# Patient Record
Sex: Female | Born: 1960 | Race: Black or African American | Hispanic: No | Marital: Single | Smoking: Current every day smoker
Health system: Southern US, Community
[De-identification: ages and names within clinical notes are randomized; demographics above are authoritative.]

## PROBLEM LIST (undated history)

## (undated) DIAGNOSIS — I2510 Atherosclerotic heart disease of native coronary artery without angina pectoris: Secondary | ICD-10-CM

## (undated) DIAGNOSIS — F1410 Cocaine abuse, uncomplicated: Secondary | ICD-10-CM

## (undated) DIAGNOSIS — K219 Gastro-esophageal reflux disease without esophagitis: Secondary | ICD-10-CM

## (undated) DIAGNOSIS — F319 Bipolar disorder, unspecified: Secondary | ICD-10-CM

## (undated) DIAGNOSIS — D649 Anemia, unspecified: Secondary | ICD-10-CM

## (undated) DIAGNOSIS — F329 Major depressive disorder, single episode, unspecified: Secondary | ICD-10-CM

## (undated) DIAGNOSIS — I10 Essential (primary) hypertension: Secondary | ICD-10-CM

## (undated) DIAGNOSIS — E7800 Pure hypercholesterolemia, unspecified: Secondary | ICD-10-CM

## (undated) DIAGNOSIS — Z8659 Personal history of other mental and behavioral disorders: Secondary | ICD-10-CM

## (undated) DIAGNOSIS — M1990 Unspecified osteoarthritis, unspecified site: Secondary | ICD-10-CM

## (undated) DIAGNOSIS — F419 Anxiety disorder, unspecified: Secondary | ICD-10-CM

## (undated) DIAGNOSIS — T7840XA Allergy, unspecified, initial encounter: Secondary | ICD-10-CM

## (undated) DIAGNOSIS — G4730 Sleep apnea, unspecified: Secondary | ICD-10-CM

## (undated) DIAGNOSIS — F32A Depression, unspecified: Secondary | ICD-10-CM

## (undated) HISTORY — PX: ROTATOR CUFF REPAIR: SHX139

## (undated) HISTORY — PX: ECTOPIC PREGNANCY SURGERY: SHX613

## (undated) HISTORY — DX: Major depressive disorder, single episode, unspecified: F32.9

## (undated) HISTORY — DX: Depression, unspecified: F32.A

## (undated) HISTORY — PX: HAND SURGERY: SHX662

## (undated) HISTORY — DX: Personal history of other mental and behavioral disorders: Z86.59

## (undated) HISTORY — DX: Anxiety disorder, unspecified: F41.9

## (undated) HISTORY — DX: Anemia, unspecified: D64.9

## (undated) HISTORY — DX: Unspecified osteoarthritis, unspecified site: M19.90

## (undated) HISTORY — DX: Allergy, unspecified, initial encounter: T78.40XA

---

## 2007-08-11 ENCOUNTER — Ambulatory Visit: Payer: Self-pay | Admitting: *Deleted

## 2007-11-22 ENCOUNTER — Ambulatory Visit: Payer: Self-pay | Admitting: *Deleted

## 2007-12-13 ENCOUNTER — Emergency Department: Payer: Self-pay

## 2009-08-09 ENCOUNTER — Emergency Department: Payer: Self-pay | Admitting: Emergency Medicine

## 2009-08-15 ENCOUNTER — Emergency Department: Payer: Self-pay | Admitting: Emergency Medicine

## 2011-01-25 ENCOUNTER — Emergency Department: Payer: Self-pay | Admitting: Emergency Medicine

## 2011-08-19 ENCOUNTER — Emergency Department: Payer: Self-pay | Admitting: Unknown Physician Specialty

## 2011-08-19 ENCOUNTER — Emergency Department: Payer: Self-pay | Admitting: Emergency Medicine

## 2012-05-10 ENCOUNTER — Ambulatory Visit: Payer: Self-pay | Admitting: Physician Assistant

## 2012-05-30 ENCOUNTER — Ambulatory Visit: Payer: Self-pay | Admitting: Physician Assistant

## 2012-08-22 ENCOUNTER — Other Ambulatory Visit: Payer: Self-pay | Admitting: Internal Medicine

## 2012-08-26 ENCOUNTER — Other Ambulatory Visit: Payer: Self-pay | Admitting: Internal Medicine

## 2012-08-26 ENCOUNTER — Other Ambulatory Visit: Payer: Self-pay

## 2012-08-26 DIAGNOSIS — R1013 Epigastric pain: Secondary | ICD-10-CM

## 2012-08-26 DIAGNOSIS — R1310 Dysphagia, unspecified: Secondary | ICD-10-CM

## 2012-09-01 ENCOUNTER — Ambulatory Visit: Payer: Self-pay

## 2012-09-02 ENCOUNTER — Ambulatory Visit
Admission: RE | Admit: 2012-09-02 | Discharge: 2012-09-02 | Disposition: A | Discharge status: Home / Self Care | Payer: Medicaid Other | Source: Ambulatory Visit | Attending: Internal Medicine | Admitting: Internal Medicine

## 2012-09-02 ENCOUNTER — Other Ambulatory Visit: Payer: Self-pay | Admitting: Internal Medicine

## 2012-09-02 DIAGNOSIS — R1013 Epigastric pain: Secondary | ICD-10-CM

## 2012-09-23 ENCOUNTER — Ambulatory Visit: Payer: Self-pay | Admitting: Physician Assistant

## 2012-12-01 ENCOUNTER — Ambulatory Visit: Payer: Self-pay | Admitting: Physician Assistant

## 2013-01-04 ENCOUNTER — Other Ambulatory Visit: Payer: Self-pay | Admitting: Internal Medicine

## 2013-01-04 DIAGNOSIS — M25511 Pain in right shoulder: Secondary | ICD-10-CM

## 2013-01-12 ENCOUNTER — Other Ambulatory Visit: Payer: Medicaid Other

## 2013-01-13 ENCOUNTER — Ambulatory Visit: Payer: Self-pay | Admitting: Physician Assistant

## 2013-01-17 ENCOUNTER — Other Ambulatory Visit: Payer: Medicaid Other

## 2013-01-27 ENCOUNTER — Other Ambulatory Visit: Payer: Medicaid Other

## 2013-03-21 ENCOUNTER — Ambulatory Visit: Payer: Self-pay | Admitting: Gastroenterology

## 2013-03-22 ENCOUNTER — Observation Stay: Payer: Self-pay | Admitting: Internal Medicine

## 2013-03-22 LAB — BASIC METABOLIC PANEL
Calcium, Total: 9.6 mg/dL (ref 8.5–10.1)
Co2: 24 mmol/L (ref 21–32)
Creatinine: 0.98 mg/dL (ref 0.60–1.30)
EGFR (African American): >60
EGFR (Non-African Amer.): >60
Osmolality: 274 (ref 275–301)
Sodium: 137 mmol/L (ref 136–145)

## 2013-03-22 LAB — CBC
HGB: 12.2 g/dL (ref 12.0–16.0)
MCHC: 33.7 g/dL (ref 32.0–36.0)
Platelet: 253 10*3/uL (ref 150–440)
RBC: 4.15 10*6/uL (ref 3.80–5.20)

## 2013-03-23 LAB — PATHOLOGY REPORT

## 2013-08-04 ENCOUNTER — Ambulatory Visit: Payer: Self-pay | Admitting: Physician Assistant

## 2013-10-13 ENCOUNTER — Ambulatory Visit: Payer: Self-pay | Admitting: Physician Assistant

## 2014-01-04 DIAGNOSIS — M75121 Complete rotator cuff tear or rupture of right shoulder, not specified as traumatic: Secondary | ICD-10-CM | POA: Insufficient documentation

## 2014-05-01 ENCOUNTER — Ambulatory Visit: Payer: Self-pay | Admitting: Unknown Physician Specialty

## 2014-05-01 LAB — BASIC METABOLIC PANEL
ANION GAP: 5 — AB (ref 7–16)
BUN: 6 mg/dL — ABNORMAL LOW (ref 7–18)
CO2: 29 mmol/L (ref 21–32)
Calcium, Total: 9.1 mg/dL (ref 8.5–10.1)
Chloride: 105 mmol/L (ref 98–107)
Creatinine: 1.18 mg/dL (ref 0.60–1.30)
EGFR (African American): >60
EGFR (Non-African Amer.): 51 — ABNORMAL LOW
Glucose: 96 mg/dL (ref 65–99)
Osmolality: 275 (ref 275–301)
POTASSIUM: 4.3 mmol/L (ref 3.5–5.1)
Sodium: 139 mmol/L (ref 136–145)

## 2014-05-09 ENCOUNTER — Ambulatory Visit: Payer: Self-pay | Admitting: Unknown Physician Specialty

## 2014-07-17 DIAGNOSIS — Z9889 Other specified postprocedural states: Secondary | ICD-10-CM | POA: Insufficient documentation

## 2014-07-31 ENCOUNTER — Encounter
Admit: 2014-07-31 | Disposition: A | Discharge status: Home / Self Care | Payer: Self-pay | Attending: Unknown Physician Specialty | Admitting: Unknown Physician Specialty

## 2014-08-10 NOTE — H&P (Signed)
PATIENT NAME:  Christina Richards, Christina Richards MR#:  [id number] DATE OF BIRTH:  March 24, 1961  DATE OF ADMISSION:  03/22/2013  PRIMARY CARE PHYSICIAN: Mary Sella. Turner, PA-C  CHIEF COMPLAINT: Angioedema.   HISTORY OF PRESENT ILLNESS: This is a 54 year old female who presented yesterday for a colonoscopy. She was given routine sedatives of which I am not sure which ones. In any case, she went home and this morning she noticed that her lower lip was swollen. She came to the ER for further evaluation. During the course of the ER visit, her swelling actually increased and now it is her lower and upper lips. She has received treatment for allergic reaction including steroids, Benadryl, but the swelling had increased, so we are admitting her for observation.   REVIEW OF SYSTEMS: CONSTITUTIONAL: No fevers, fatigue, weakness. EYES: No blurred or double vision.  ENT: No tinnitus, ear pain, hearing loss, seasonal allergies, or postnasal drip.  RESPIRATORY: No cough, wheezing, hemoptysis, COPD.  CARDIOVASCULAR: No chest pain, orthopnea, palpitations, or syncope. GASTROINTESTINAL: No nausea, vomiting, diarrhea, abdominal pain.  GENITOURINARY: No dysuria or hematuria. ENDOCRINE: No polyuria, polydipsia.  HEMOLYMPHATIC: No easy bruising.  SKIN: No rash or lesion. MUSCULOSKELETAL: No limited activity. NEUROLOGIC: No history of CVA or TIA.  PSYCHIATRIC: Positive history of manic depression.   PAST MEDICAL HISTORY:  1.  Hyperlipidemia.  2.  GERD.  3.  Manic depressive.  4.  Hypertension.  5.  Anxiety.  6.  Paranoid schizophrenic.  7.  Bipolar disorder.   PAST SURGICAL HISTORY:  1.  Ectopic pregnancy.  2.  Her left thumb.   MEDICATIONS:  Include: 1.  Trazodone 100 mg daily.  2.  Seroquel 200 mg two tablets daily.  3.  Pantoprazole 40 mg daily.  4.  Lisinopril 20 mg daily.  5.  Linzess 290 mcg daily.  6.  Hydroxyzine 50 mg daily.  7.  Citalopram 20 mg daily.  8.  Cardizem 240 mg daily.  9.  Benztropine 1 mg  b.i.d.   ALLERGIES: IBUPROFEN AND NOW PERHAPS LISINOPRIL.   FAMILY HISTORY: Positive for hypertension and diabetes.   SOCIAL HISTORY:  1.  The patient smokes a pack a day, is trying to work on quitting.  2.  No alcohol or IV drug use.   HISTORY OF PRESENT ILLNESS:  VITAL SIGNS: Temperature 98.6, pulse of 65, respirations 18, blood pressure 117/82, 100% on room air.  GENERAL: Alert, oriented, not in acute distress.  HEENT: Head is atraumatic. Pupils are round and reactive. Sclerae anicteric. Mucous membranes are moist.  OROPHARYNX: Clear. There is no throat swelling. Good vision of her oropharynx.  Her lip is very swollen.  NECK: Supple without any lymphadenopathy. No JVD. Thyroid is midline.  LUNGS: Clear to auscultation without any crackles, rales, rhonchi, or wheezing. Clear to percussion. Symmetrical expansion. No egophony or tactile fremitus.  CARDIOVASCULAR: Regular rate and rhythm. No murmurs, gallops, or rubs. Hard to palpate PMI due to body habitus.  GASTROINTESTINAL: No tenderness or mass. Bowel sounds are positive. No guarding, rebound or ecchymosis. Nondistended.  MUSCULOSKELETAL: No pathology to digits or nails. Extremities move x4.  SKIN: Without rashes or lesions. As mentioned, she has lip swelling.  NEUROLOGIC: Cranial nerves II through XII are grossly intact. There are no focal deficits. Motor strength is 4/5 bilateral in upper extremities.   LABORATORY, DIAGNOSTIC AND RADIOLOGICAL DATA: White blood cells 7, hemoglobin 12.2, hematocrit 37, platelets 253. Sodium 137, potassium 3.9, chloride 106, bicarb 24, BUN 11, creatinine 0.98, glucose 106.  ASSESSMENT AND PLAN: A 54 year old female who had a colonoscopy yesterday, is on an ACE inhibitor, who comes in with angioedema which has worsened throughout her ER course.   1.  ANGIOEDEMA, LIKELY DUE TO ACE INHIBITOR. I AM NOT SURE WHAT SHE RECEIVED DURING THE  COLONOSCOPY. I ASSUME IT IS PERHAPS PROPOFOL OR VERSED, BUT MORE THAN  LIKELY THIS ALLERGIC REACTION IS TO LISINOPRIL. WE ARE OBVIOUSLY HOLDING LISINOPRIL. LISINOPRIL HAS BEEN ADDED TO HER ALLERGY ADDED TO HER ALLERGIES. Will continue with steroids, Benadryl, and ranitidine.  2.  Hypertension. We will continue her diltiazem for now and monitor blood pressure. IF SHE NEEDS ANOTHER BLOOD PRESSURE MEDICATION, WOULD NOT OBVIOUSLY USE ANY ACE INHIBITOR OR ARB.  3.  History of bipolar affective disorder with manic depressive episode, paranoid schizophrenia. Will continue her outpatient medications.  4.  Smoking dependence. I counseled the patient regarding smoking. She is trying to quit. The patient was counseled for 4 minutes.  5.  She does not want a nicotine patch.   CODE STATUS: FULL CODE.  TIME SPENT: Approximately 40 minutes.   ____________________________ Janyth ContesSital P. Juliene PinaMody, MD spm:np D: 03/22/2013 16:18:57 ET T: 03/22/2013 18:45:19 ET JOB#: [id number]  cc: Lanyla Costello P. Juliene PinaMody, MD, <Dictator> Mary SellaEric M. Mayford Knifeurner, PA-C Maudy Yonan P Crysta Gulick MD ELECTRONICALLY SIGNED 03/23/2013 12:48

## 2014-08-10 NOTE — Discharge Summary (Signed)
PATIENT NAME:  Christina Richards, Christina Richards MR#:  [id number] DATE OF BIRTH:  08/22/1960  DATE OF ADMISSION:  03/22/2013 DATE OF DISCHARGE:  03/23/2013  ADMITTING DIAGNOSIS: Angioedema.   DISCHARGE DIAGNOSES: 1. ANGIOEDEMA, LIKELY DUE TO LISINOPRIL, QUESTIONABLE DUE TO ANESTHETIC USED FOR ESOPHAGOGASTRODUODENOSCOPY, COLONOSCOPY. 2.  Dysphagia to solids.  3.  Abdominal pain.  4.  Status post esophagogastroduodenoscopy, colonoscopy on 03/22/2013, by Dr. Servando Snare, with a normal esophagus, as well as duodenum were noted, and gastritis was noted, polyp in colon as well sigmoid diverticulosis and internal hemorrhoids.  5.  History of hypertension, schizophrenia, bipolar disorder, gastroesophageal reflux disease, as well as anxiety.   DISCHARGE CONDITION: Stable.     DISCHARGE MEDICATIONS: The patient is to continue:  1.  Benztropine 1 mg p.o. twice daily.  2.  Trazodone 100 mg p.o. at bedtime.  3.  Citalopram 20 mg p.o. daily.  4.  Hydroxyzine pamoate 50 mg p.o. daily.  5.  Pantoprazole 40 mg p.o. daily.  6.  Cardizem CD 240 mg p.o. daily.  7.  Linzess 290 mcg p.o. daily.  8.  Prednisone 60 mg p.o. once on 03/25/2013, then taper by 10 mg every 2 days.  9.  Benadryl 25 mg p.o. every 4 hours as needed.  10.  Metoprolol 100 mg p.o. twice daily.  11.   Ranitidine 150 mg p.o. twice daily.   The patient was advised not to take lisinopril anymore unless recommended by primary care physician.   HOME OXYGEN: None.   DIET: Two-gram salt, low fat, low cholesterol. Diet consistency: Mechanical soft.   ACTIVITY LIMITATIONS: As tolerated.     FOLLOWUP APPOINTMENT: With Dr. Mayford Knife in 2 days after discharge, Dr. Servando Snare in 1 week after discharge.   The patient is a 54 year old African American female with past medical history significant for history of multiple medical problems including dysphagia, who presents to the hospital with angioedema. Apparently, the patient had a colonoscopy, as well as EGD on 03/22/2013. Upon  arrival home from procedure, she noted that her lower lip was swollen. The swelling progressed from one cheek to the other cheek. She presented to the Emergency Room for further evaluation and received steroids, as well as Benadryl and Zantac. She was admitted to the hospital for further evaluation, as well as treatment and observation. On arrival to the Emergency Room, the patient's temperature was 98.6, pulse was 65, respiration rate was 18, blood pressure 117/82, saturation was 100% on room air. Physical exam was unremarkable, except for significant swelling in her periorbital area, but not tongue. The patient's lab data done in the Emergency Room revealed normal BMP, except for a high glucose of 106. The patient's CBC was within normal limits with a white blood cell count of 7.0, hemoglobin 12.2, platelet count 253. The patient was admitted to the hospital for further evaluation. She was started on steroids around-the-clock, as well as (Dictation Anomaly) Benadryl and Zantac. With this, her condition improved. The swelling improved as well. She was noted to have elevated blood pressure since her lisinopril was placed on hold, and because of that, she was started on new medication for her, labetalol. With 100 mg of labetalol, the patient's blood pressure improved from 167 systolic to 112. She is being discharged home with the above-mentioned medications and followup. She is to follow up with her primary care physician for further recommendations and followup of her blood pressure management regimen. Her vital signs on the day of discharge, temperature was 98.1, pulse was 84, respiration  rate was 19, blood pressure was 112/72, saturation was 100% on room air at rest.   Of note, in regards to her chronic medical problems including hypertension, schizophrenia, bipolar disorder, gastroesophageal reflux disease, as well as anxiety, the patient is to continue her outpatient management.     TIME SPENT: 40 minutes on  this patient.   ____________________________ Katharina Caperima Tarshia Kot, MD rv:dmm D: 03/23/2013 19:19:48 ET T: 03/23/2013 22:16:26 ET JOB#: [id number]  cc: Katharina Caperima Clemma Johnsen, MD, <Dictator> Mary SellaEric M. Turner, PA-C Midge Miniumarren Wohl, MD Katharina CaperIMA Leyla Soliz MD ELECTRONICALLY SIGNED 03/29/2013 16:31

## 2014-08-19 NOTE — Op Note (Signed)
PATIENT NAME:  Christina Richards, Christina Richards MR#:  [id number] DATE OF BIRTH:  [birth year]  DATE OF PROCEDURE:  05/09/2014  PREOPERATIVE DIAGNOSIS: Torn right rotator cuff along with long head of biceps tendinitis and subacromial  impingement.   POSTOPERATIVE DIAGNOSIS:  Torn right rotator cuff along with long head of biceps tendinitis and impingement.   OPERATION: Arthroscopic release of the long head of the biceps tendon followed by arthroscopic subacromial decompression plus mini incision rotator cuff repair.   SURGEON:  Alda BertholdHarold B Gunter Conde, Jr., Richards.D.   ANESTHESIA: General.   HISTORY: The patient had a long history of right shoulder pain. She actually had a history of an injury to her shoulder in the past. Ultimately, she [facility]. Injection of right subacromial bursa with steroid and anesthetic did not relieve her symptoms. X-rays reveal no significant pathology. MRI was consistent with a large rotator cuff tear. The patient was ultimately brought in for surgery.   DESCRIPTION OF PROCEDURE: The patient was taken to the Operating Room where satisfactory general anesthesia was achieved. The patient turned to the lateral decubitus position with the right shoulder up. The right shoulder was prepped and draped in the usual fashion for about arthroscopic mini incision approach. The patient was given 2 grams of Kefzol IV prior to the start of the procedure. Incidentally, the shoulder was suspended with Acufex shoulder suspension device. The right upper extremity was maintained in about 25 degrees of abduction. We used 12 pounds of traction throughout the procedure.   The scope was introduced into the glenohumeral joint through a posterior portal. The joint was distended with lactated Ringer's. Inspection of the glenohumeral joint revealed the articular surfaces were smooth. The labrum was frayed. The biceps tendon was slightly frayed. 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 I went ahead and established an anterior portal and used an ArthroCare angled wand to perform a limited synovectomy. I released the long head of the biceps tendon attachment to the labrum with the angled ArthroCare wand.  Then I inserted the scope in the subacromial space. Inspection revealed a large rotator cuff tear that involved the supraspinatus and a portionof the infraspinatus. There was  significant fraying along the undersurface of the acromion, and there was a large spur along the inferior aspect of the anterior acromion. Lateral portal was established. I brought an ArthroCare wand in through this portal and debrided the frayed tissue on the undersurface of the acromion and debrided thickened bursal tissue.   I then switched the scope to the lateral portal and brought  an acromionizer bur in through the posterior portal and then a subacromial decompression was performed. I then switched the scope back to the posterior portal and brought a large round bur in through the lateral portal and lightly decorticated the area of the intended reattachment of the cuff.  I then removed the arthroscopic instruments and made a lateral incision beginning at the lateral scope portal and extended approximately to the edge of the acromion. The deltoid was divided in line with its fibers. A portion of the anterior deltoid was reflected off the acromion. I then went ahead and mobilized the cuff  and repaired it to the greater tuberosity. I used essentially a two row repair. Medially, I  used two Spartan anchors with #2 nonabsorbable sutures. They were placed through the cuff in horizontal mattress fashion and tied.  Then I brought these sutures over to speed  screws that were placed laterally. The sutures were crisscrossed, bringing the cuff down to the prepared greater tuberosity bed. Repair seemed to be fairly snug.   I need to add that I did make some vent holes in  the greater tuberosity prior to the repair. I used a small thin awl. The wound was  irrigated with saline and then I went ahead and reattached the released portion of the anterior deltoid to the acromion with a #2 Ethibond suture and then the incision with the deltoid were closed side to side with 0 Vicryl and subcutaneous with 2-0 Vicryl and the skin with skin staples. The anterior and posterior puncture wounds were closed with 3-0 nylon in vertical mattress fashion. Total of about 25 to 30 mL of half percent Marcaine was injected into the incision.  Betadine was  applied to the wounds. Four TENS pads were applied. Sterile dressing was then applied and then next, a shoulder immobilizer was applied to the patient's right upper extremity. The patient was then turned supine and awakened and transferred to her stretcher bed. She was taken to the recovery room in satisfactory condition.   ESTIMATED BLOOD LOSS:  Was negligible.     ____________________________ Alda Berthold., MD hbk:at D: 05/09/2014 10:29:36 ET T: 05/09/2014 10:48:42 ET JOB#: [id number]  cc: Alda Berthold., MD, <Dictator> Randon Goldsmith, Montez Hageman MD ELECTRONICALLY SIGNED 06/01/2014 15:22

## 2015-03-24 ENCOUNTER — Emergency Department
Admission: EM | Admit: 2015-03-24 | Discharge: 2015-03-24 | Disposition: A | Discharge status: Home / Self Care | Payer: Medicaid Other | Attending: Emergency Medicine | Admitting: Emergency Medicine

## 2015-03-24 ENCOUNTER — Emergency Department: Payer: Medicaid Other

## 2015-03-24 ENCOUNTER — Encounter: Payer: Self-pay | Admitting: Emergency Medicine

## 2015-03-24 DIAGNOSIS — Y9289 Other specified places as the place of occurrence of the external cause: Secondary | ICD-10-CM | POA: Insufficient documentation

## 2015-03-24 DIAGNOSIS — W109XXA Fall (on) (from) unspecified stairs and steps, initial encounter: Secondary | ICD-10-CM | POA: Diagnosis not present

## 2015-03-24 DIAGNOSIS — Y998 Other external cause status: Secondary | ICD-10-CM | POA: Insufficient documentation

## 2015-03-24 DIAGNOSIS — S93401A Sprain of unspecified ligament of right ankle, initial encounter: Secondary | ICD-10-CM

## 2015-03-24 DIAGNOSIS — F17200 Nicotine dependence, unspecified, uncomplicated: Secondary | ICD-10-CM | POA: Diagnosis not present

## 2015-03-24 DIAGNOSIS — S99911A Unspecified injury of right ankle, initial encounter: Secondary | ICD-10-CM | POA: Diagnosis present

## 2015-03-24 DIAGNOSIS — Y9389 Activity, other specified: Secondary | ICD-10-CM | POA: Diagnosis not present

## 2015-03-24 MED ORDER — IBUPROFEN 800 MG PO TABS
800.0000 mg | ORAL_TABLET | Freq: Once | ORAL | Status: AC
  Administered 2015-03-24: 800 mg via ORAL
  Filled 2015-03-24: qty 1

## 2015-03-24 MED ORDER — OXYCODONE-ACETAMINOPHEN 5-325 MG PO TABS: 1.0000 | ORAL_TABLET | Freq: Four times a day (QID) | ORAL | Status: DC | PRN

## 2015-03-24 MED ORDER — OXYCODONE-ACETAMINOPHEN 5-325 MG PO TABS
2.0000 | ORAL_TABLET | Freq: Once | ORAL | Status: AC
  Administered 2015-03-24: 2 via ORAL
  Filled 2015-03-24: qty 2

## 2015-03-24 MED ORDER — IBUPROFEN 800 MG PO TABS: 800.0000 mg | ORAL_TABLET | Freq: Three times a day (TID) | ORAL | Status: DC | PRN

## 2015-03-24 NOTE — Discharge Instructions (Signed)
Ankle Sprain  An ankle sprain is an injury to the strong, fibrous tissues (ligaments) that hold the bones of your ankle joint together.   CAUSES  An ankle sprain is usually caused by a fall or by twisting your ankle. Ankle sprains most commonly occur when you step on the outer edge of your foot, and your ankle turns inward. People who participate in sports are more prone to these types of injuries.   SYMPTOMS    Pain in your ankle. The pain may be present at rest or only when you are trying to stand or walk.   Swelling.   Bruising. Bruising may develop immediately or within 1 to 2 days after your injury.   Difficulty standing or walking, particularly when turning corners or changing directions.  DIAGNOSIS   Your caregiver will ask you details about your injury and perform a physical exam of your ankle to determine if you have an ankle sprain. During the physical exam, your caregiver will press on and apply pressure to specific areas of your foot and ankle. Your caregiver will try to move your ankle in certain ways. An X-ray exam may be done to be sure a bone was not broken or a ligament did not separate from one of the bones in your ankle (avulsion fracture).   TREATMENT   Certain types of braces can help stabilize your ankle. Your caregiver can make a recommendation for this. Your caregiver may recommend the use of medicine for pain. If your sprain is severe, your caregiver may refer you to a surgeon who helps to restore function to parts of your skeletal system (orthopedist) or a physical therapist.  HOME CARE INSTRUCTIONS    Apply ice to your injury for 1-2 days or as directed by your caregiver. Applying ice helps to reduce inflammation and pain.    Put ice in a plastic bag.    Place a towel between your skin and the bag.    Leave the ice on for 15-20 minutes at a time, every 2 hours while you are awake.   Only take over-the-counter or prescription medicines for pain, discomfort, or fever as directed by  your caregiver.   Elevate your injured ankle above the level of your heart as much as possible for 2-3 days.   If your caregiver recommends crutches, use them as instructed. Gradually put weight on the affected ankle. Continue to use crutches or a cane until you can walk without feeling pain in your ankle.   If you have a plaster splint, wear the splint as directed by your caregiver. Do not rest it on anything harder than a pillow for the first 24 hours. Do not put weight on it. Do not get it wet. You may take it off to take a shower or bath.   You may have been given an elastic bandage to wear around your ankle to provide support. If the elastic bandage is too tight (you have numbness or tingling in your foot or your foot becomes cold and blue), adjust the bandage to make it comfortable.   If you have an air splint, you may blow more air into it or let air out to make it more comfortable. You may take your splint off at night and before taking a shower or bath. Wiggle your toes in the splint several times per day to decrease swelling.  SEEK MEDICAL CARE IF:    You have rapidly increasing bruising or swelling.   Your toes feel   extremely cold or you lose feeling in your foot.   Your pain is not relieved with medicine.  SEEK IMMEDIATE MEDICAL CARE IF:   Your toes are numb or blue.   You have severe pain that is increasing.  MAKE SURE YOU:    Understand these instructions.   Will watch your condition.   Will get help right away if you are not doing well or get worse.     This information is not intended to replace advice given to you by your health care provider. Make sure you discuss any questions you have with your health care provider.     Document Released: 04/06/2005 Document Revised: 04/27/2014 Document Reviewed: 04/18/2011  Elsevier Interactive Patient Education 2016 [employer]

## 2015-03-24 NOTE — ED Notes (Signed)
Right ankle swollen, painful. No acute deformity.

## 2015-03-24 NOTE — ED Provider Notes (Signed)
[facility] Emergency Department Provider Note ___________________________________________  Time seen: Approximately 11:04 AM  I have reviewed the triage vital signs and the nursing notes.   HISTORY  Chief Complaint Ankle Pain   HPI Christina Richards is a 54 y.o. female who presents to the emergency department for evaluation of right ankle pain. She states that she was going down her steps and twisted her ankle last night. She states that it was painful all night and when she awakened this morning it was very swollen and she is unable to bear any weight on that side.  History reviewed. No pertinent past medical history.  There are no active problems to display for this patient.   History reviewed. No pertinent past surgical history.  Current Outpatient Rx  Name  Route  Sig  Dispense  Refill  . ibuprofen (ADVIL,MOTRIN) 800 MG tablet   Oral   Take 1 tablet (800 mg total) by mouth every 8 (eight) hours as needed.   30 tablet   0   . oxyCODONE-acetaminophen (ROXICET) 5-325 MG tablet   Oral   Take 1 tablet by mouth every 6 (six) hours as needed.   9 tablet   0     Allergies Review of patient's allergies indicates no known allergies.  No family history on file.  Social History Social History  Substance Use Topics  . Smoking status: Current Every Day Smoker  . Smokeless tobacco: None  . Alcohol Use: No    Review of Systems Constitutional: No recent illness. Eyes: No visual changes. ENT: No sore throat. Cardiovascular: Denies chest pain or palpitations. Respiratory: Denies shortness of breath. Gastrointestinal: No abdominal pain.  Genitourinary: Negative for dysuria. Musculoskeletal: Pain in right ankle Skin: Negative for rash. Neurological: Negative for headaches, focal weakness or numbness. 10-point ROS otherwise negative.  ____________________________________________   PHYSICAL EXAM:  VITAL SIGNS: ED Triage Vitals  Enc Vitals Group      BP --      Pulse --      Resp --      Temp --      Temp src --      SpO2 --      Weight --      Height --      Head Cir --      Peak Flow --      Pain Score 03/24/15 1032 8     Pain Loc --      Pain Edu? --      Excl. in GC? --     Constitutional: Alert and oriented. Well appearing and in no acute distress. Eyes: Conjunctivae are normal. EOMI. Head: Atraumatic. Nose: No congestion/rhinnorhea. Neck: No stridor.  Respiratory: Normal respiratory effort.   Musculoskeletal: Bilateral right ankle Neurologic:  Normal speech and language. No gross focal neurologic deficits are appreciated. Speech is normal. No gait instability. Skin:  Skin is warm, dry and intact. Atraumatic. Psychiatric: Mood and affect are normal. Speech and behavior are normal.  ____________________________________________   LABS (all labs ordered are listed, but only abnormal results are displayed)  Labs Reviewed - No data to display ____________________________________________  RADIOLOGY  Negative for acute bony abnormality per radiology.  I, Kem Boroughs, personally viewed and evaluated these images (plain radiographs) as part of my medical decision making.   ____________________________________________   PROCEDURES  Procedure(s) performed:   Ankle stirrup splint applied by RN. Neurovascularly intact post-application.   ____________________________________________   INITIAL IMPRESSION / ASSESSMENT AND PLAN /  ED COURSE  Pertinent labs & imaging results that were available during my care of the patient were reviewed by me and considered in my medical decision making (see chart for details).  Patient was advised to call tomorrow to schedule follow-up with the orthopedist. She was advised to use ice and elevate her foot. She was advised to avoid weightbearing. She was advised to return to the emergency department for symptoms that change or worsen if unable schedule an  appointment. ____________________________________________   FINAL CLINICAL IMPRESSION(S) / ED DIAGNOSES  Final diagnoses:  Ankle sprain, right, initial encounter       Christina PesterCari B Sayana Salley, FNP 03/24/15 1204  Sharyn CreamerMark Quale, MD 03/24/15 [phone]

## 2015-03-24 NOTE — ED Notes (Addendum)
States she is having pain to right ankle states she fell yesterday

## 2015-03-30 ENCOUNTER — Emergency Department: Payer: Medicaid Other

## 2015-03-30 ENCOUNTER — Emergency Department
Admission: EM | Admit: 2015-03-30 | Discharge: 2015-03-30 | Disposition: A | Discharge status: Home / Self Care | Payer: Medicaid Other | Attending: Emergency Medicine | Admitting: Emergency Medicine

## 2015-03-30 ENCOUNTER — Encounter: Payer: Self-pay | Admitting: Emergency Medicine

## 2015-03-30 DIAGNOSIS — F17200 Nicotine dependence, unspecified, uncomplicated: Secondary | ICD-10-CM | POA: Diagnosis not present

## 2015-03-30 DIAGNOSIS — S93401D Sprain of unspecified ligament of right ankle, subsequent encounter: Secondary | ICD-10-CM | POA: Insufficient documentation

## 2015-03-30 DIAGNOSIS — X58XXXD Exposure to other specified factors, subsequent encounter: Secondary | ICD-10-CM | POA: Insufficient documentation

## 2015-03-30 DIAGNOSIS — I10 Essential (primary) hypertension: Secondary | ICD-10-CM | POA: Insufficient documentation

## 2015-03-30 DIAGNOSIS — Z79899 Other long term (current) drug therapy: Secondary | ICD-10-CM | POA: Insufficient documentation

## 2015-03-30 DIAGNOSIS — S99911D Unspecified injury of right ankle, subsequent encounter: Secondary | ICD-10-CM | POA: Diagnosis present

## 2015-03-30 HISTORY — DX: Atherosclerotic heart disease of native coronary artery without angina pectoris: I25.10

## 2015-03-30 HISTORY — DX: Essential (primary) hypertension: I10

## 2015-03-30 HISTORY — DX: Gastro-esophageal reflux disease without esophagitis: K21.9

## 2015-03-30 HISTORY — DX: Pure hypercholesterolemia, unspecified: E78.00

## 2015-03-30 MED ORDER — KETOROLAC TROMETHAMINE 60 MG/2ML IM SOLN
60.0000 mg | Freq: Once | INTRAMUSCULAR | Status: AC
  Administered 2015-03-30: 60 mg via INTRAMUSCULAR
  Filled 2015-03-30: qty 2

## 2015-03-30 MED ORDER — OXYCODONE-ACETAMINOPHEN 5-325 MG PO TABS: 1.0000 | ORAL_TABLET | ORAL | Status: DC | PRN

## 2015-03-30 MED ORDER — NAPROXEN 500 MG PO TABS
500.0000 mg | ORAL_TABLET | Freq: Two times a day (BID) | ORAL | Status: DC
Start: 2015-03-30 — End: 2018-06-06

## 2015-03-30 NOTE — ED Notes (Signed)
States she had an injury to right ankle about 1 week ago conts to have pain with swelling .inhaled corticosteroids out of pain meds and has appt with ortho on the 15th of this month

## 2015-03-30 NOTE — ED Notes (Signed)
Patient presents to the ED with right ankle pain.  Patient "rolled" her ankle on the 4th of December and was seen in the ED.  Patient reports now being out of pain medication and continuing to have severe pain and difficulty walking.  Patient presents via EMS.  Patient has an ace wrap to her right ankle.

## 2015-03-30 NOTE — ED Provider Notes (Signed)
[facility] Emergency Department Provider Note  ____________________________________________  Time seen: Approximately 10:15 AM  I have reviewed the triage vital signs and the nursing notes.   HISTORY  Chief Complaint Ankle Pain   HPI Christina Richards is a 54 y.o. female who presents for evaluation of right ankle pain. Patient was seen here one ago for the same. States that she rolled her ankle fell on top of it and is having continued pain. Reports that the oxycodone and ibuprofen together are not helping.   Past Medical History  Diagnosis Date  . GERD (gastroesophageal reflux disease)   . Hypertension   . Coronary artery disease   . Hypercholesteremia     There are no active problems to display for this patient.   History reviewed. No pertinent past surgical history.  Current Outpatient Rx  Name  Route  Sig  Dispense  Refill  . atorvastatin (LIPITOR) 20 MG tablet   Oral   Take 20 mg by mouth daily.         . citalopram (CELEXA) 20 MG tablet   Oral   Take 20 mg by mouth daily.         Marland Kitchen. diltiazem (TIAZAC) 240 MG 24 hr capsule   Oral   Take 240 mg by mouth daily.         . hydrOXYzine (ATARAX/VISTARIL) 50 MG tablet   Oral   Take 50 mg by mouth 3 (three) times daily as needed.         . labetalol (NORMODYNE) 100 MG tablet   Oral   Take 100 mg by mouth 2 (two) times daily.         . pantoprazole (PROTONIX) 40 MG tablet   Oral   Take 40 mg by mouth daily.         . traZODone (DESYREL) 100 MG tablet   Oral   Take 100 mg by mouth at bedtime.         . naproxen (NAPROSYN) 500 MG tablet   Oral   Take 1 tablet (500 mg total) by mouth 2 (two) times daily with a meal.   60 tablet   0   . oxyCODONE-acetaminophen (ROXICET) 5-325 MG tablet   Oral   Take 1-2 tablets by mouth every 4 (four) hours as needed for severe pain.   20 tablet   0     Allergies Review of patient's allergies indicates no known allergies.  No family  history on file.  Social History Social History  Substance Use Topics  . Smoking status: Current Every Day Smoker  . Smokeless tobacco: None  . Alcohol Use: No    Review of Systems Constitutional: No fever/chills Eyes: No visual changes. ENT: No sore throat. Cardiovascular: Denies chest pain. Respiratory: Denies shortness of breath. Gastrointestinal: No abdominal pain.  No nausea, no vomiting.  No diarrhea.  No constipation. Genitourinary: Negative for dysuria. Musculoskeletal: Positive for right ankle pain. Skin: Negative for rash. Neurological: Negative for headaches, focal weakness or numbness.  10-point ROS otherwise negative.  ____________________________________________   PHYSICAL EXAM:  VITAL SIGNS: ED Triage Vitals  Enc Vitals Group     BP 03/30/15 0935 143/101 mmHg     Pulse Rate 03/30/15 0935 68     Resp 03/30/15 0935 18     Temp 03/30/15 0935 98.2 F (36.8 C)     Temp Source 03/30/15 0935 Oral     SpO2 03/30/15 0935 98 %     Weight  03/30/15 0935 280 lb (127.007 kg)     Height 03/30/15 0935  (1.626 m)     Head Cir --      Peak Flow --      Pain Score 03/30/15 0934 10     Pain Loc --      Pain Edu? --      Excl. in GC? --     Constitutional: Alert and oriented. Well appearing and in no acute distress. Cardiovascular: Normal rate, regular rhythm. Grossly normal heart sounds.  Good peripheral circulation. Respiratory: Normal respiratory effort.  No retractions. Lungs CTAB. Musculoskeletal: Positive for right ankle pain and swelling. Distally neurovascularly intact. Tendernessalong the posterior aspect of the foot along the calcaneal area. Neurologic:  Normal speech and language. No gross focal neurologic deficits are appreciated. No gait instability. Skin:  Skin is warm, dry and intact. No rash noted. Psychiatric: Mood and affect are normal. Speech and behavior are normal.  ____________________________________________   LABS (all labs ordered  are listed, but only abnormal results are displayed)  Labs Reviewed - No data to display ____________________________________________ RADIOLOGY  No new fracture dislocation. ____________________________________________   PROCEDURES  Procedure(s) performed: None  Critical Care performed: No  ____________________________________________   INITIAL IMPRESSION / ASSESSMENT AND PLAN / ED COURSE  Pertinent labs & imaging results that were available during my care of the patient were reviewed by me and considered in my medical decision making (see chart for details).  Right ankle sprain continuous. Patient to maintain splint as applied from one week ago. Continue current pain medications and to follow-up with orthopedics as scheduled. ____________________________________________   FINAL CLINICAL IMPRESSION(S) / ED DIAGNOSES  Final diagnoses:  Ankle sprain, right, subsequent encounter      Evangeline Dakin, PA-C 03/30/15 1540  Jene Every, MD 03/31/15 1326

## 2015-12-31 ENCOUNTER — Encounter: Payer: Medicaid Other | Admitting: Podiatry

## 2016-01-15 ENCOUNTER — Other Ambulatory Visit: Payer: Self-pay | Admitting: Nurse Practitioner

## 2016-01-15 DIAGNOSIS — Z1231 Encounter for screening mammogram for malignant neoplasm of breast: Secondary | ICD-10-CM

## 2016-01-17 ENCOUNTER — Ambulatory Visit: Payer: Medicaid Other | Admitting: Podiatry

## 2016-01-28 ENCOUNTER — Ambulatory Visit: Payer: Medicaid Other | Admitting: Podiatry

## 2016-02-04 ENCOUNTER — Ambulatory Visit: Payer: Self-pay | Admitting: Podiatry

## 2016-02-06 ENCOUNTER — Ambulatory Visit: Payer: Medicaid Other | Attending: Nurse Practitioner

## 2016-02-18 ENCOUNTER — Ambulatory Visit: Payer: Self-pay | Admitting: Podiatry

## 2016-05-15 NOTE — Progress Notes (Signed)
This encounter was created in error - please disregard.

## 2017-04-21 ENCOUNTER — Ambulatory Visit: Payer: Self-pay | Admitting: Nurse Practitioner

## 2017-04-27 ENCOUNTER — Other Ambulatory Visit: Payer: Self-pay

## 2017-04-27 MED ORDER — LABETALOL HCL 100 MG PO TABS: 100.0000 mg | ORAL_TABLET | Freq: Two times a day (BID) | ORAL | 2 refills | Status: DC

## 2017-04-27 MED ORDER — DILTIAZEM HCL ER BEADS 360 MG PO CP24: 360.0000 mg | ORAL_CAPSULE | Freq: Every day | ORAL | 3 refills | Status: DC

## 2017-04-28 ENCOUNTER — Other Ambulatory Visit: Payer: Self-pay

## 2017-04-28 NOTE — Telephone Encounter (Signed)
Called phar and canceled labetalol 100mg  and called in labetalol 200mg  twice a day

## 2017-05-05 DIAGNOSIS — J019 Acute sinusitis, unspecified: Secondary | ICD-10-CM | POA: Insufficient documentation

## 2017-05-05 DIAGNOSIS — B3731 Acute candidiasis of vulva and vagina: Secondary | ICD-10-CM | POA: Insufficient documentation

## 2017-05-05 DIAGNOSIS — N760 Acute vaginitis: Secondary | ICD-10-CM | POA: Insufficient documentation

## 2017-05-05 DIAGNOSIS — I10 Essential (primary) hypertension: Secondary | ICD-10-CM | POA: Insufficient documentation

## 2017-05-05 DIAGNOSIS — Z6841 Body Mass Index (BMI) 40.0 and over, adult: Secondary | ICD-10-CM | POA: Insufficient documentation

## 2017-05-05 DIAGNOSIS — B373 Candidiasis of vulva and vagina: Secondary | ICD-10-CM | POA: Insufficient documentation

## 2017-05-06 ENCOUNTER — Ambulatory Visit: Payer: Self-pay | Admitting: Nurse Practitioner

## 2017-05-09 DIAGNOSIS — Z9889 Other specified postprocedural states: Secondary | ICD-10-CM

## 2017-05-09 HISTORY — DX: Other specified postprocedural states: Z98.890

## 2017-05-18 ENCOUNTER — Other Ambulatory Visit: Payer: Self-pay | Admitting: Nurse Practitioner

## 2017-05-18 DIAGNOSIS — G8929 Other chronic pain: Secondary | ICD-10-CM

## 2017-05-18 DIAGNOSIS — M545 Low back pain, unspecified: Secondary | ICD-10-CM

## 2017-05-18 MED ORDER — TRAMADOL HCL 50 MG PO TABS: 100.0000 mg | ORAL_TABLET | Freq: Three times a day (TID) | ORAL | 0 refills | Status: DC | PRN

## 2017-05-18 NOTE — Progress Notes (Signed)
Renewed rx for tramadol 50mg , two tablets TID prn pain. #30 tablets sent in. She will need to be seen for further refills.

## 2017-07-05 ENCOUNTER — Encounter: Payer: Self-pay | Admitting: Nurse Practitioner

## 2017-07-05 ENCOUNTER — Ambulatory Visit (INDEPENDENT_AMBULATORY_CARE_PROVIDER_SITE_OTHER): Payer: Medicaid Other | Admitting: Nurse Practitioner

## 2017-07-05 VITALS — BP 144/96 | HR 74 | Temp 98.2°F | Resp 16 | Ht 63.0 in | Wt 289.0 lb

## 2017-07-05 DIAGNOSIS — M545 Low back pain, unspecified: Secondary | ICD-10-CM

## 2017-07-05 DIAGNOSIS — I10 Essential (primary) hypertension: Secondary | ICD-10-CM | POA: Diagnosis not present

## 2017-07-05 DIAGNOSIS — G8929 Other chronic pain: Secondary | ICD-10-CM

## 2017-07-05 DIAGNOSIS — J0140 Acute pansinusitis, unspecified: Secondary | ICD-10-CM | POA: Diagnosis not present

## 2017-07-05 MED ORDER — TRAMADOL HCL 50 MG PO TABS: 100.0000 mg | ORAL_TABLET | Freq: Three times a day (TID) | ORAL | 2 refills | Status: DC | PRN

## 2017-07-05 MED ORDER — AZITHROMYCIN 250 MG PO TABS: ORAL_TABLET | ORAL | 0 refills | Status: DC

## 2017-07-05 NOTE — Progress Notes (Signed)
[facility] [address] [place], [place] [address]  Internal MEDICINE  Office Visit Note  Patient Name: Christina Richards  [id number]  [id number]  Date of Service: 07/25/2017  Chief Complaint  Patient presents with  . URI  . Osteoarthritis     The patient is here for sick visit. She has nasal congestion, sore throat and headache. Symptoms have been going on for few weeks. nothing makes the symptoms worse. Has not tried to take anything to help the symptoms.  Continues to have issues with bilateral shoulder pain. Did have surgery on rotator cuff of right shoulder. Needs to have surgery on the left shoulder. Takes tramadol as needed for severe pain. This helps without causing negative side effects.  She has brought Fl-2 form with her which needs to be filled out and returned when complete.    Pt is here for a sick visit.     Current Medication:  Outpatient Encounter Medications as of 07/05/2017  Medication Sig  . ARIPiprazole ER (ABILIFY MAINTENA) 300 MG PRSY prefilled syringe Inject 300 mg into the muscle every 28 (twenty-eight) days.  . montelukast (SINGULAIR) 10 MG tablet Take 10 mg by mouth at bedtime.  . traMADol (ULTRAM) 50 MG tablet Take 2 tablets (100 mg total) by mouth every 8 (eight) hours as needed.  . traZODone (DESYREL) 100 MG tablet Take 100 mg by mouth at bedtime.  . [DISCONTINUED] traMADol (ULTRAM) 50 MG tablet Take 2 tablets (100 mg total) by mouth every 8 (eight) hours as needed.  Marland Kitchen atorvastatin (LIPITOR) 20 MG tablet Take 20 mg by mouth daily.  Marland Kitchen azithromycin (ZITHROMAX) 250 MG tablet z-pack - take as directed for 5 days  . citalopram (CELEXA) 20 MG tablet Take 20 mg by mouth daily.  Marland Kitchen diltiazem (TIAZAC) 360 MG 24 hr capsule Take 1 capsule (360 mg total) by mouth daily.  . hydrOXYzine (ATARAX/VISTARIL) 50 MG tablet Take 50 mg by mouth 3 (three) times daily as needed.  . labetalol (NORMODYNE) 200 MG tablet Take 200 mg by mouth 2 (two) times daily.  .  naproxen (NAPROSYN) 500 MG tablet Take 1 tablet (500 mg total) by mouth 2 (two) times daily with a meal.  . pantoprazole (PROTONIX) 40 MG tablet Take 40 mg by mouth daily.  . [DISCONTINUED] oxyCODONE-acetaminophen (ROXICET) 5-325 MG tablet Take 1-2 tablets by mouth every 4 (four) hours as needed for severe pain.   No facility-administered encounter medications on file as of 07/05/2017.       Medical History: Past Medical History:  Diagnosis Date  . Allergy    Environmental  . Anemia   . Anxiety   . Coronary artery disease   . Depression   . GERD (gastroesophageal reflux disease)   . Hypercholesteremia   . Hypertension      Today's Vitals   07/05/17 1515  BP: (!) 144/96  Pulse: 74  Resp: 16  Temp: 98.2 F (36.8 C)  TempSrc: Oral  SpO2: 98%  Weight: 289 lb (131.1 kg)  Height: 5\' 3"  (1.6 m)    Review of Systems  Constitutional: Positive for chills and fatigue.  HENT: Positive for congestion, ear pain, postnasal drip, rhinorrhea and sinus pain. Negative for sore throat and voice change.   Respiratory: Positive for cough and wheezing.   Cardiovascular: Negative.   Gastrointestinal: Negative.   Endocrine: Negative for cold intolerance, heat intolerance, polydipsia, polyphagia and polyuria.  Genitourinary: Negative.   Musculoskeletal: Positive for arthralgias.  Bilateral shoulder pain.   Allergic/Immunologic: Positive for environmental allergies.  Neurological: Positive for headaches.  Psychiatric/Behavioral:       Regularly sees psychiatry    Physical Exam  Constitutional: She is oriented to person, place, and time. She appears well-developed and well-nourished. No distress.  HENT:  Head: Normocephalic and atraumatic.  Right Ear: Tympanic membrane is erythematous and bulging.  Left Ear: Tympanic membrane is erythematous and bulging.  Nose: Rhinorrhea present. Right sinus exhibits maxillary sinus tenderness and frontal sinus tenderness. Left sinus exhibits  frontal sinus tenderness.  Mouth/Throat: Oropharynx is clear and moist. No oropharyngeal exudate.  Eyes: Pupils are equal, round, and reactive to light. EOM are normal.  Neck: Normal range of motion. Neck supple. No JVD present. No tracheal deviation present. No thyromegaly present.  Cardiovascular: Normal rate, regular rhythm and normal heart sounds. Exam reveals no gallop and no friction rub.  No murmur heard. Pulmonary/Chest: Effort normal. No respiratory distress. She has no wheezes. She has no rales. She exhibits no tenderness.  Abdominal: Soft. Bowel sounds are normal.  Musculoskeletal: Normal range of motion.  Lymphadenopathy:    She has no cervical adenopathy.  Neurological: She is alert and oriented to person, place, and time. No cranial nerve deficit.  Skin: Skin is warm and dry. She is not diaphoretic.  Psychiatric: She has a normal mood and affect. Her behavior is normal. Judgment and thought content normal.  Nursing note and vitals reviewed.  Assessment/Plan: 1. Acute pansinusitis, recurrence not specified - azithromycin (ZITHROMAX) 250 MG tablet; z-pack - take as directed for 5 days  Dispense: 6 tablet; Refill: 0 Recommend use of OTC medications to relieve symptoms.   2. Chronic low back pain without sciatica, unspecified back pain laterality - traMADol (ULTRAM) 50 MG tablet; Take 2 tablets (100 mg total) by mouth every 8 (eight) hours as needed.  Dispense: 30 tablet; Refill: 2  3. Essential (primary) hypertension Stable. Continue to use bp medications as prescribed.   General Counseling: Christina EpleyLisa verbalizes understanding of the findings of todays visit and agrees with plan of treatment. I have discussed any further diagnostic evaluation that may be needed or ordered today. We also reviewed her medications today. she has been encouraged to call the office with any questions or concerns that should arise related to todays visit.  Rest and increase fluids. Continue using OTC  medication to control symptoms.   This patient was seen by Vincent GrosHeather Chang Tiggs, FNP- C in Collaboration with Dr Lyndon CodeFozia M Khan as a part of collaborative care agreement   Meds ordered this encounter  Medications  . azithromycin (ZITHROMAX) 250 MG tablet    Sig: z-pack - take as directed for 5 days    Dispense:  6 tablet    Refill:  0    Order Specific Question:   Supervising Provider    Answer:   Lyndon CodeKHAN, FOZIA M [[id number]]  . traMADol (ULTRAM) 50 MG tablet    Sig: Take 2 tablets (100 mg total) by mouth every 8 (eight) hours as needed.    Dispense:  30 tablet    Refill:  2    Patient will need to be seen for further refills.    Order Specific Question:   Supervising Provider    Answer:   Lyndon CodeKHAN, FOZIA M [[id number]]    Time spent: 25 Minutes

## 2017-07-06 ENCOUNTER — Telehealth: Payer: Self-pay | Admitting: Nurse Practitioner

## 2017-07-07 NOTE — Telephone Encounter (Signed)
Finished this and Sela HuaKrupa just faxed it

## 2017-07-25 DIAGNOSIS — M545 Low back pain, unspecified: Secondary | ICD-10-CM | POA: Insufficient documentation

## 2017-07-25 DIAGNOSIS — G8929 Other chronic pain: Secondary | ICD-10-CM | POA: Insufficient documentation

## 2017-08-31 ENCOUNTER — Telehealth: Payer: Self-pay

## 2017-08-31 ENCOUNTER — Other Ambulatory Visit: Payer: Self-pay | Admitting: Nurse Practitioner

## 2017-08-31 DIAGNOSIS — J0140 Acute pansinusitis, unspecified: Secondary | ICD-10-CM

## 2017-08-31 MED ORDER — AMOXICILLIN-POT CLAVULANATE 875-125 MG PO TABS: 1.0000 | ORAL_TABLET | Freq: Two times a day (BID) | ORAL | 0 refills | Status: DC

## 2017-08-31 MED ORDER — METHYLPREDNISOLONE 4 MG PO TBPK
ORAL_TABLET | ORAL | 0 refills | Status: DC
Start: 2017-08-31 — End: 2018-10-20

## 2017-08-31 NOTE — Telephone Encounter (Signed)
Sent in prescription for augmentin bod for 10 days. Will have her do medrol dose pack for 6 days. Use otc meds to alleviate symptoms. Prescriptions sent to [facility].

## 2017-08-31 NOTE — Progress Notes (Signed)
Sent in prescription for augmentin bod for 10 days. Will have her do medrol dose pack for 6 days. Use otc meds to alleviate symptoms. Prescriptions sent to [facility].

## 2017-09-01 ENCOUNTER — Other Ambulatory Visit: Payer: Self-pay | Admitting: Internal Medicine

## 2017-09-01 MED ORDER — OMEPRAZOLE MAGNESIUM 20 MG PO TBEC: DELAYED_RELEASE_TABLET | ORAL | 2 refills | Status: DC

## 2017-09-01 MED ORDER — LABETALOL HCL 200 MG PO TABS: 200.0000 mg | ORAL_TABLET | Freq: Two times a day (BID) | ORAL | 2 refills | Status: DC

## 2017-09-02 ENCOUNTER — Telehealth: Payer: Self-pay

## 2017-09-02 NOTE — Telephone Encounter (Signed)
Left vm advising pt that we sent pred pk and antibiotic to pharmacy.  dbs

## 2017-09-29 ENCOUNTER — Other Ambulatory Visit: Payer: Self-pay | Admitting: Nurse Practitioner

## 2017-09-29 MED ORDER — DILTIAZEM HCL ER BEADS 360 MG PO CP24: 360.0000 mg | ORAL_CAPSULE | Freq: Every day | ORAL | 3 refills | Status: DC

## 2017-10-05 ENCOUNTER — Other Ambulatory Visit: Payer: Self-pay | Admitting: Nurse Practitioner

## 2017-10-08 ENCOUNTER — Telehealth: Payer: Self-pay

## 2017-10-08 NOTE — Telephone Encounter (Signed)
What did she need?

## 2017-10-08 NOTE — Telephone Encounter (Signed)
Gave this to krupa accidentally sent this to you.

## 2017-10-14 ENCOUNTER — Telehealth: Payer: Self-pay

## 2017-10-14 NOTE — Telephone Encounter (Signed)
[facility] has been [facility] due to non compliance of physician orders and appointment status.  Christina Richards

## 2017-12-03 ENCOUNTER — Other Ambulatory Visit: Payer: Self-pay | Admitting: Nurse Practitioner

## 2018-06-06 ENCOUNTER — Other Ambulatory Visit: Payer: Self-pay

## 2018-06-06 ENCOUNTER — Encounter: Payer: Self-pay | Admitting: Emergency Medicine

## 2018-06-06 ENCOUNTER — Emergency Department
Admission: EM | Admit: 2018-06-06 | Discharge: 2018-06-06 | Disposition: A | Discharge status: Home / Self Care | Payer: Medicaid Other | Attending: Emergency Medicine | Admitting: Emergency Medicine

## 2018-06-06 DIAGNOSIS — I2510 Atherosclerotic heart disease of native coronary artery without angina pectoris: Secondary | ICD-10-CM | POA: Insufficient documentation

## 2018-06-06 DIAGNOSIS — I10 Essential (primary) hypertension: Secondary | ICD-10-CM | POA: Diagnosis not present

## 2018-06-06 DIAGNOSIS — Z79899 Other long term (current) drug therapy: Secondary | ICD-10-CM | POA: Diagnosis not present

## 2018-06-06 DIAGNOSIS — J0140 Acute pansinusitis, unspecified: Secondary | ICD-10-CM

## 2018-06-06 DIAGNOSIS — R0981 Nasal congestion: Secondary | ICD-10-CM | POA: Diagnosis present

## 2018-06-06 DIAGNOSIS — Z760 Encounter for issue of repeat prescription: Secondary | ICD-10-CM | POA: Insufficient documentation

## 2018-06-06 DIAGNOSIS — J0100 Acute maxillary sinusitis, unspecified: Secondary | ICD-10-CM | POA: Diagnosis not present

## 2018-06-06 DIAGNOSIS — F17210 Nicotine dependence, cigarettes, uncomplicated: Secondary | ICD-10-CM | POA: Diagnosis not present

## 2018-06-06 MED ORDER — DILTIAZEM HCL ER BEADS 360 MG PO CP24: 360.0000 mg | ORAL_CAPSULE | Freq: Every day | ORAL | 3 refills | Status: DC

## 2018-06-06 MED ORDER — AMOXICILLIN-POT CLAVULANATE 875-125 MG PO TABS: 1.0000 | ORAL_TABLET | Freq: Two times a day (BID) | ORAL | 0 refills | Status: DC

## 2018-06-06 MED ORDER — LABETALOL HCL 200 MG PO TABS: 200.0000 mg | ORAL_TABLET | Freq: Two times a day (BID) | ORAL | 3 refills | Status: DC

## 2018-06-06 NOTE — ED Triage Notes (Signed)
Cold like symptoms x1 week  . Sinus pressure , right ear pressure.

## 2018-06-06 NOTE — ED Notes (Signed)
See triage note  Presents with right ear pain,sinus pressure and cough  States cough is occasional prod   Green in color

## 2018-06-06 NOTE — ED Triage Notes (Signed)
Blood pressure is elevated , pt currently out of her meds and is requesting refills

## 2018-06-06 NOTE — ED Provider Notes (Signed)
[facility] Emergency Department Provider Note   ____________________________________________   First MD Initiated Contact with Patient 06/06/18 [phone]     (approximate)  I have reviewed the triage vital signs and the nursing notes.   HISTORY  Chief Complaint Cough    HPI Christina Richards is a 58 y.o. female patient presents with nasal congestion and thick greenish nasal drainage.  Patient state complaint has increased in the past week.  Patient also requesting refill of hypertension medication.  Patient denies fever/chills associated complaint.  Patient denies nausea, vomiting, diarrhea.  Patient denies headache vision disturbance or weakness    Past Medical History:  Diagnosis Date  . Allergy    Environmental  . Anemia   . Anxiety   . Coronary artery disease   . Depression   . GERD (gastroesophageal reflux disease)   . Hypercholesteremia   . Hypertension     Patient Active Problem List   Diagnosis Date Noted  . Chronic low back pain without sciatica 07/25/2017  . Acute vaginitis 05/05/2017  . Candidiasis of vulva and vagina 05/05/2017  . Acute sinusitis, unspecified 05/05/2017  . Essential (primary) hypertension 05/05/2017  . Body mass index 45.0-49.9, adult (HCC) 05/05/2017    Past Surgical History:  Procedure Laterality Date  . ECTOPIC PREGNANCY SURGERY    . ROTATOR CUFF REPAIR      Prior to Admission medications   Medication Sig Start Date End Date Taking? Authorizing Provider  benztropine (COGENTIN) 2 MG tablet Take 2 mg by mouth 2 (two) times daily.   Yes [provider]  amoxicillin-clavulanate (AUGMENTIN) 875-125 MG tablet Take 1 tablet by mouth 2 (two) times daily. 06/06/18   Joni Reining, PA-C  ARIPiprazole ER (ABILIFY MAINTENA) 300 MG PRSY prefilled syringe Inject 300 mg into the muscle every 28 (twenty-eight) days.    [provider]  atorvastatin (LIPITOR) 20 MG tablet Take 20 mg by mouth daily.    [provider]  azithromycin (ZITHROMAX) 250 MG tablet z-pack - take as directed for 5 days 07/05/17   Carlean Jews, NP  citalopram (CELEXA) 20 MG tablet Take 20 mg by mouth daily.    [provider]  diltiazem (TIAZAC) 360 MG 24 hr capsule Take 1 capsule (360 mg total) by mouth daily. 06/06/18   Joni Reining, PA-C  hydrOXYzine (ATARAX/VISTARIL) 50 MG tablet Take 50 mg by mouth 3 (three) times daily as needed.    [provider]  labetalol (NORMODYNE) 200 MG tablet Take 1 tablet (200 mg total) by mouth 2 (two) times daily. 06/06/18   Joni Reining, PA-C  methylPREDNISolone (MEDROL) 4 MG TBPK tablet Take by mouth as directed for 6 days 08/31/17   Carlean Jews, NP  montelukast (SINGULAIR) 10 MG tablet Take 10 mg by mouth at bedtime.    [provider]  traMADol (ULTRAM) 50 MG tablet Take 2 tablets (100 mg total) by mouth every 8 (eight) hours as needed. 07/05/17   Carlean Jews, NP  traZODone (DESYREL) 100 MG tablet Take 100 mg by mouth at bedtime.    [provider]    Allergies Patient has no known allergies.  No family history on file.  Social History Social History   Tobacco Use  . Smoking status: Current Some Day Smoker  . Smokeless tobacco: Never Used  Substance Use Topics  . Alcohol use: No    Frequency: Never  . Drug use: Yes    Types: "Crack"  cocaine, Amyl nitrate    Review of Systems  Constitutional: No fever/chills Eyes: No visual changes. ENT: Nasal congestion and facial pain. Cardiovascular: Denies chest pain. Respiratory: Denies shortness of breath. Gastrointestinal: No abdominal pain.  No nausea, no vomiting.  No diarrhea.  No constipation. Genitourinary: Negative for dysuria. Musculoskeletal: Chronic back pain. Skin: Negative for rash. Neurological: Negative for headaches, focal weakness or numbness. Endocrine:  Hypertension. ____________________________________________   PHYSICAL EXAM:  VITAL SIGNS: ED  Triage Vitals  Enc Vitals Group     BP 06/06/18 0825 (!) 133/102     Pulse Rate 06/06/18 0825 74     Resp 06/06/18 0825 18     Temp 06/06/18 0825 98.1 F (36.7 C)     Temp Source 06/06/18 0825 Oral     SpO2 06/06/18 0825 98 %     Weight 06/06/18 0826 275 lb (124.7 kg)     Height 06/06/18 0826 5\' 4"  (1.626 m)     Head Circumference --      Peak Flow --      Pain Score 06/06/18 0826 7     Pain Loc --      Pain Edu? --      Excl. in GC? --     Constitutional: Alert and oriented. Well appearing and in no acute distress. Nose: Lateral maxillary guarding.  Bilateral edematous nasal turbinate.   Mouth/Throat: Mucous membranes are moist.  Oropharynx non-erythematous.  Postnasal drainage. Neck: No stridor.   Cardiovascular: Normal rate, regular rhythm. Grossly normal heart sounds.  Good peripheral circulation. Respiratory: Normal respiratory effort.  No retractions. Lungs CTAB. Skin:  Skin is warm, dry and intact. No rash noted. Psychiatric: Mood and affect are normal. Speech and behavior are normal.  ____________________________________________   LABS (all labs ordered are listed, but only abnormal results are displayed)  Labs Reviewed - No data to display ____________________________________________  EKG   ____________________________________________  RADIOLOGY  ED MD interpretation:    Official radiology report(s): No results found.  ____________________________________________   PROCEDURES  Procedure(s) performed: None  Procedures  Critical Care performed: No  ____________________________________________   INITIAL IMPRESSION / ASSESSMENT AND PLAN / ED COURSE  As part of my medical decision making, I reviewed the following data within the electronic MEDICAL RECORD NUMBER     Nasal congestion and facial pain secondary to subacute maxillary sinusitis.  Patient blood pressure refill request.  Patient given discharge care instruction advised [facility].      ____________________________________________   FINAL CLINICAL IMPRESSION(S) / ED DIAGNOSES  Final diagnoses:  Subacute maxillary sinusitis  Encounter for medication refill     ED Discharge Orders         Ordered    diltiazem (TIAZAC) 360 MG 24 hr capsule  Daily     06/06/18 0944    labetalol (NORMODYNE) 200 MG tablet  2 times daily     06/06/18 0944    amoxicillin-clavulanate (AUGMENTIN) 875-125 MG tablet  2 times daily     06/06/18 0944           Note:  This document was prepared using Dragon voice recognition software and may include unintentional dictation errors.    Joni Reining, PA-C 06/06/18 5374    Dionne Bucy, MD 06/06/18 [phone]

## 2018-10-20 ENCOUNTER — Other Ambulatory Visit: Payer: Self-pay

## 2018-10-20 ENCOUNTER — Ambulatory Visit: Payer: Medicaid Other | Admitting: Adult Health

## 2018-10-20 ENCOUNTER — Encounter: Payer: Self-pay | Admitting: Adult Health

## 2018-10-20 VITALS — BP 128/85 | HR 83 | Resp 16 | Ht 64.0 in | Wt 260.0 lb

## 2018-10-20 DIAGNOSIS — G8929 Other chronic pain: Secondary | ICD-10-CM

## 2018-10-20 DIAGNOSIS — M25552 Pain in left hip: Secondary | ICD-10-CM

## 2018-10-20 DIAGNOSIS — Z1239 Encounter for other screening for malignant neoplasm of breast: Secondary | ICD-10-CM | POA: Diagnosis not present

## 2018-10-20 DIAGNOSIS — M545 Low back pain, unspecified: Secondary | ICD-10-CM

## 2018-10-20 DIAGNOSIS — M25511 Pain in right shoulder: Secondary | ICD-10-CM | POA: Diagnosis not present

## 2018-10-20 DIAGNOSIS — E119 Type 2 diabetes mellitus without complications: Secondary | ICD-10-CM | POA: Diagnosis not present

## 2018-10-20 DIAGNOSIS — M25512 Pain in left shoulder: Secondary | ICD-10-CM

## 2018-10-20 DIAGNOSIS — Z1211 Encounter for screening for malignant neoplasm of colon: Secondary | ICD-10-CM

## 2018-10-20 DIAGNOSIS — R5383 Other fatigue: Secondary | ICD-10-CM

## 2018-10-20 LAB — POCT GLYCOSYLATED HEMOGLOBIN (HGB A1C): Hemoglobin A1C: 5.7 % — AB (ref 4.0–5.6)

## 2018-10-20 MED ORDER — LABETALOL HCL 200 MG PO TABS: 200.0000 mg | ORAL_TABLET | Freq: Two times a day (BID) | ORAL | 2 refills | Status: DC

## 2018-10-20 MED ORDER — DILTIAZEM HCL ER BEADS 360 MG PO CP24: 360.0000 mg | ORAL_CAPSULE | Freq: Every day | ORAL | 2 refills | Status: DC

## 2018-10-20 MED ORDER — TRAMADOL HCL 50 MG PO TABS: 100.0000 mg | ORAL_TABLET | Freq: Three times a day (TID) | ORAL | 0 refills | Status: DC | PRN

## 2018-10-20 NOTE — Patient Instructions (Signed)

## 2018-10-20 NOTE — Progress Notes (Signed)
[facility] [address] [place], [place] [address]  Internal MEDICINE  Office Visit Note  Patient Name: Christina SongsterLisa M Richards  [id number]  [id number]  Date of Service: 10/20/2018   Complaints/HPI Pt is here for establishment of PCP. Chief Complaint  Patient presents with  . Hypertension    re establish care ,needs medication refills   . Hyperlipidemia  . Gastroesophageal Reflux  . Quality Metric Gaps    pap, mammogram, colonoscopy   HPI Pt is here to reestablish care. She has a history of HLD, GERD and chronic shoulder pain.  She also sees psychiatry at [facility] for Schizoaffective disorder. She is currently compliant with her medications for them.  She is mainly here today to get her preventative health maintanace ordered, and to get a refill on her tramadol.  She reports her shoulders and neck are hurting very bad.  She has been taking tramadol intermittently for this pain, for awhile.  She reports her HTN and Genella RifeGerd are currently controlled.      Current Medication: Outpatient Encounter Medications as of 10/20/2018  Medication Sig  . ARIPiprazole ER (ABILIFY MAINTENA) 400 MG PRSY prefilled syringe Inject 400 mg into the muscle every 28 (twenty-eight) days.  . benztropine (COGENTIN) 2 MG tablet Take 2 mg by mouth 2 (two) times daily.  . citalopram (CELEXA) 20 MG tablet Take 20 mg by mouth daily.  Marland Kitchen. diltiazem (TIAZAC) 360 MG 24 hr capsule Take 1 capsule (360 mg total) by mouth daily.  . hydrOXYzine (ATARAX/VISTARIL) 50 MG tablet Take 50 mg by mouth 3 (three) times daily as needed.  . labetalol (NORMODYNE) 200 MG tablet Take 1 tablet (200 mg total) by mouth 2 (two) times daily.  . montelukast (SINGULAIR) 10 MG tablet Take 10 mg by mouth at bedtime.  Marland Kitchen. QUEtiapine (SEROQUEL) 100 MG tablet Take 100 mg by mouth at bedtime.  . traMADol (ULTRAM) 50 MG tablet Take 2 tablets (100 mg total) by mouth every 8 (eight) hours as needed.  . traZODone (DESYREL) 100 MG tablet Take 100 mg  by mouth at bedtime. 3 tablets at night  . [DISCONTINUED] ARIPiprazole ER (ABILIFY MAINTENA) 300 MG PRSY prefilled syringe Inject 300 mg into the muscle every 28 (twenty-eight) days.  . [DISCONTINUED] amoxicillin-clavulanate (AUGMENTIN) 875-125 MG tablet Take 1 tablet by mouth 2 (two) times daily. (Patient not taking: Reported on 10/20/2018)  . [DISCONTINUED] atorvastatin (LIPITOR) 20 MG tablet Take 20 mg by mouth daily.  . [DISCONTINUED] azithromycin (ZITHROMAX) 250 MG tablet z-pack - take as directed for 5 days (Patient not taking: Reported on 10/20/2018)  . [DISCONTINUED] methylPREDNISolone (MEDROL) 4 MG TBPK tablet Take by mouth as directed for 6 days (Patient not taking: Reported on 10/20/2018)   No facility-administered encounter medications on file as of 10/20/2018.     Surgical History: Past Surgical History:  Procedure Laterality Date  . ECTOPIC PREGNANCY SURGERY    . ROTATOR CUFF REPAIR      Medical History: Past Medical History:  Diagnosis Date  . Allergy    Environmental  . Anemia   . Anxiety   . Coronary artery disease   . Depression   . GERD (gastroesophageal reflux disease)   . Hypercholesteremia   . Hypertension     Family History: History reviewed. No pertinent family history.  Social History   Socioeconomic History  . Marital status: Single    Spouse name: Not on file  . Number of children: Not on file  . Years of education: Not  on file  . Highest education level: Not on file  Occupational History  . Not on file  Social Needs  . Financial resource strain: Not on file  . Food insecurity    Worry: Not on file    Inability: Not on file  . Transportation needs    Medical: Not on file    Non-medical: Not on file  Tobacco Use  . Smoking status: Light Tobacco Smoker  . Smokeless tobacco: Never Used  Substance and Sexual Activity  . Alcohol use: No    Frequency: Never  . Drug use: Yes    Types: "Crack" cocaine, Amyl nitrate  . Sexual activity: Never   Lifestyle  . Physical activity    Days per week: Not on file    Minutes per session: Not on file  . Stress: Not on file  Relationships  . Social Herbalist on phone: Not on file    Gets together: Not on file    Attends religious service: Not on file    Active member of club or organization: Not on file    Attends meetings of clubs or organizations: Not on file    Relationship status: Not on file  . Intimate partner violence    Fear of current or ex partner: Not on file    Emotionally abused: Not on file    Physically abused: Not on file    Forced sexual activity: Not on file  Other Topics Concern  . Not on file  Social History Narrative  . Not on file     Review of Systems  Constitutional: Negative for chills, fatigue and unexpected weight change.  HENT: Negative for congestion, rhinorrhea, sneezing and sore throat.   Eyes: Negative for photophobia, pain and redness.  Respiratory: Negative for cough, chest tightness and shortness of breath.   Cardiovascular: Negative for chest pain and palpitations.  Gastrointestinal: Negative for abdominal pain, constipation, diarrhea, nausea and vomiting.  Endocrine: Negative.   Genitourinary: Negative for dysuria and frequency.  Musculoskeletal: Negative for arthralgias, back pain, joint swelling and neck pain.  Skin: Negative for rash.  Allergic/Immunologic: Negative.   Neurological: Negative for tremors and numbness.  Hematological: Negative for adenopathy. Does not bruise/bleed easily.  Psychiatric/Behavioral: Negative for behavioral problems and sleep disturbance. The patient is not nervous/anxious.     Vital Signs: BP 128/85   Pulse 83   Resp 16   Ht 5\' 4"  (1.626 m)   Wt 260 lb (117.9 kg)   SpO2 96%   BMI 44.63 kg/m    Physical Exam Vitals signs and nursing note reviewed.  Constitutional:      General: She is not in acute distress.    Appearance: She is well-developed. She is not diaphoretic.  HENT:      Head: Normocephalic and atraumatic.     Mouth/Throat:     Pharynx: No oropharyngeal exudate.  Eyes:     Pupils: Pupils are equal, round, and reactive to light.  Neck:     Musculoskeletal: Normal range of motion and neck supple.     Thyroid: No thyromegaly.     Vascular: No JVD.     Trachea: No tracheal deviation.  Cardiovascular:     Rate and Rhythm: Normal rate and regular rhythm.     Heart sounds: Normal heart sounds. No murmur. No friction rub. No gallop.   Pulmonary:     Effort: Pulmonary effort is normal. No respiratory distress.     Breath sounds: Normal  breath sounds. No wheezing or rales.  Chest:     Chest wall: No tenderness.  Abdominal:     Palpations: Abdomen is soft.     Tenderness: There is no abdominal tenderness. There is no guarding.  Musculoskeletal: Normal range of motion.  Lymphadenopathy:     Cervical: No cervical adenopathy.  Skin:    General: Skin is warm and dry.  Neurological:     Mental Status: She is alert and oriented to person, place, and time.     Cranial Nerves: No cranial nerve deficit.  Psychiatric:        Behavior: Behavior normal.        Thought Content: Thought content normal.        Judgment: Judgment normal.    Assessment/Plan: 1. Diabetes mellitus without complication (HCC) A1C is 5.7 today.   - POCT HgB A1C  2. Chronic pain of both shoulders Pt had Right Rotator cuff repair.  Needs left, however both have chronic issues.  - traMADol (ULTRAM) 50 MG tablet; Take 2 tablets (100 mg total) by mouth every 8 (eight) hours as needed.  Dispense: 30 tablet; Refill: 0  3. Pain of left hip joint X ray of hip ordered  - DG Hip Unilat W OR W/O Pelvis 1V Left; Future  4. Screening for breast cancer Mammogram ordered - MM DIGITAL SCREENING BILATERAL; Future  5. Screen for colon cancer Pt is due for colonoscopy - Ambulatory referral to Gastroenterology  6. Fatigue, unspecified type Fatigue labs ordered for patient.  - CBC with  Differential/Platelet - Lipid Panel With LDL/HDL Ratio - TSH - T4, free - Comprehensive metabolic panel - Z61B12 and Folate Panel - Fe+TIBC+Fer - Vitamin D 1,25 dihydroxy  7. Chronic low back pain without sciatica, unspecified back pain laterality Pt was on Tramadol as previous patient for chronic low back pain.  She has been having severe pain in her shoulder, neck and back.  She would like to restart tramadol.  I agreed to refill tramadol for now, but we did discuss that she may have to see pain management for ongoing treatment if the need for controlled substances continues.  - traMADol (ULTRAM) 50 MG tablet; Take 2 tablets (100 mg total) by mouth every 8 (eight) hours as needed.  Dispense: 30 tablet; Refill: 0  General Counseling: Misty StanleyLisa verbalizes understanding of the findings of todays visit and agrees with plan of treatment. I have discussed any further diagnostic evaluation that may be needed or ordered today. We also reviewed her medications today. she has been encouraged to call the office with any questions or concerns that should arise related to todays visit.  Orders Placed This Encounter  Procedures  . POCT HgB A1C    No orders of the defined types were placed in this encounter.   Time spent: 25 Minutes   This patient was seen by Blima LedgerAdam Jacoria Keiffer AGNP-C in Collaboration with Dr Lyndon CodeFozia M Khan as a part of collaborative care agreement  Johnna AcostaAdam J. Petrea Fredenburg AGNP-C Internal Medicine

## 2018-10-24 ENCOUNTER — Other Ambulatory Visit: Payer: Self-pay

## 2018-10-24 ENCOUNTER — Telehealth: Payer: Self-pay

## 2018-10-24 DIAGNOSIS — Z1211 Encounter for screening for malignant neoplasm of colon: Secondary | ICD-10-CM

## 2018-10-24 NOTE — Telephone Encounter (Signed)
Gastroenterology Pre-Procedure Review  Request Date: 11/04/18 Requesting Physician: Dr. Bonna Gains  PATIENT REVIEW QUESTIONS: The patient responded to the following health history questions as indicated:    1. Are you having any GI issues? yes (Constipation) 2. Do you have a personal history of Polyps? no 3. Do you have a family history of Colon Cancer or Polyps? no 4. Diabetes Mellitus? no 5. Joint replacements in the past 12 months?no 6. Major health problems in the past 3 months?no 7. Any artificial heart valves, MVP, or defibrillator?no    MEDICATIONS & ALLERGIES:    Patient reports the following regarding taking any anticoagulation/antiplatelet therapy:   Plavix, Coumadin, Eliquis, Xarelto, Lovenox, Pradaxa, Brilinta, or Effient? no Aspirin? no  Patient confirms/reports the following medications:  Current Outpatient Medications  Medication Sig Dispense Refill  . ARIPiprazole ER (ABILIFY MAINTENA) 400 MG PRSY prefilled syringe Inject 400 mg into the muscle every 28 (twenty-eight) days.    . benztropine (COGENTIN) 2 MG tablet Take 2 mg by mouth 2 (two) times daily.    . citalopram (CELEXA) 20 MG tablet Take 20 mg by mouth daily.    Marland Kitchen diltiazem (TIAZAC) 360 MG 24 hr capsule Take 1 capsule (360 mg total) by mouth daily. 90 capsule 2  . hydrOXYzine (ATARAX/VISTARIL) 50 MG tablet Take 50 mg by mouth 3 (three) times daily as needed.    . labetalol (NORMODYNE) 200 MG tablet Take 1 tablet (200 mg total) by mouth 2 (two) times daily. 180 tablet 2  . montelukast (SINGULAIR) 10 MG tablet Take 10 mg by mouth at bedtime.    Marland Kitchen QUEtiapine (SEROQUEL) 100 MG tablet Take 100 mg by mouth at bedtime.    . traMADol (ULTRAM) 50 MG tablet Take 2 tablets (100 mg total) by mouth every 8 (eight) hours as needed. 30 tablet 0  . traZODone (DESYREL) 100 MG tablet Take 100 mg by mouth at bedtime. 3 tablets at night     No current facility-administered medications for this visit.     Patient  confirms/reports the following allergies:  Allergies  Allergen Reactions  . Ibuprofen Nausea Only    No orders of the defined types were placed in this encounter.   AUTHORIZATION INFORMATION Primary Insurance: 1D#: Group #:  Secondary Insurance: 1D#: Group #:  SCHEDULE INFORMATION: Date: 11/04/18 Time: Location:[facility]

## 2018-10-31 ENCOUNTER — Other Ambulatory Visit: Payer: Self-pay

## 2018-10-31 ENCOUNTER — Telehealth: Payer: Self-pay | Admitting: Gastroenterology

## 2018-10-31 ENCOUNTER — Ambulatory Visit
Admission: RE | Admit: 2018-10-31 | Discharge: 2018-10-31 | Disposition: A | Discharge status: Home / Self Care | Payer: Medicaid Other | Source: Ambulatory Visit | Attending: Adult Health | Admitting: Adult Health

## 2018-10-31 DIAGNOSIS — M25552 Pain in left hip: Secondary | ICD-10-CM | POA: Insufficient documentation

## 2018-10-31 MED ORDER — NA SULFATE-K SULFATE-MG SULF 17.5-3.13-1.6 GM/177ML PO SOLN: 1.0000 | Freq: Once | ORAL | 0 refills | Status: AC

## 2018-10-31 NOTE — Telephone Encounter (Signed)
Patient called & states she has not received her instructions for the colonoscopy & is scheduled for covid testing tomorrow. Please sent instructions to her email @ [email or web address]. Please called the prep into [facility] in [place] on [address].

## 2018-10-31 NOTE — Telephone Encounter (Signed)
LVM for patient to let her know her rx for Suprep has been sent to pharmacy.  Her instructions for colonoscopy have been emailed.  Asked pt to call me back if she has any questions.  Thanks [employer]

## 2018-11-01 ENCOUNTER — Other Ambulatory Visit: Payer: Self-pay | Admitting: Internal Medicine

## 2018-11-01 ENCOUNTER — Other Ambulatory Visit
Admission: RE | Admit: 2018-11-01 | Discharge: 2018-11-01 | Disposition: A | Discharge status: Home / Self Care | Payer: Medicaid Other | Source: Ambulatory Visit | Attending: Gastroenterology | Admitting: Gastroenterology

## 2018-11-01 ENCOUNTER — Telehealth: Payer: Self-pay

## 2018-11-01 DIAGNOSIS — M1380 Other specified arthritis, unspecified site: Secondary | ICD-10-CM

## 2018-11-01 DIAGNOSIS — Z1159 Encounter for screening for other viral diseases: Secondary | ICD-10-CM | POA: Insufficient documentation

## 2018-11-01 NOTE — Progress Notes (Signed)
fsh

## 2018-11-01 NOTE — Telephone Encounter (Signed)
Called pt and notified her that her x ray is normal. Pt was advised to get her lab work done as soon as possible so we are able to further any possible treatment and that DFK has added FSH/LH. Pt stated she will be sure to get labs done tomorrow morning.

## 2018-11-01 NOTE — Telephone Encounter (Signed)
-----   Message from Lavera Guise, MD sent at 11/01/2018  9:34 AM EDT ----- Can u call the pt and tell her she needs to go and have lab work done, I added FSH/LH, her Xray of the hip was WNL, she needs to go have lab work done before getting any other treatment

## 2018-11-02 LAB — SARS CORONAVIRUS 2 (TAT 6-24 HRS): SARS Coronavirus 2: NEGATIVE

## 2018-11-03 ENCOUNTER — Telehealth: Payer: Self-pay | Admitting: Gastroenterology

## 2018-11-03 ENCOUNTER — Encounter: Payer: Self-pay | Admitting: *Deleted

## 2018-11-03 NOTE — Telephone Encounter (Signed)
Pt is calling she has a procedure for tomorrow and ate food today she had eggs, salmon Pattys , and a fruit cocktail she states she could not read he instructions because she can not see well, she wuld like for someone  to go over her instructions with her and needs to reschedule her procedure please

## 2018-11-03 NOTE — Telephone Encounter (Signed)
Contacted patient explained to her that we need to reschedule her procedure.  I will call her back tomorrow to reschedule and go over the instructions with her in detail. Patient was unable to read instructions as she could not see well.  Thanks [employer]

## 2018-11-04 ENCOUNTER — Other Ambulatory Visit: Payer: Self-pay

## 2018-11-04 ENCOUNTER — Telehealth: Payer: Self-pay

## 2018-11-04 ENCOUNTER — Encounter: Admission: RE | Payer: Self-pay | Source: Home / Self Care

## 2018-11-04 ENCOUNTER — Ambulatory Visit: Admission: RE | Admit: 2018-11-04 | Payer: Medicaid Other | Source: Home / Self Care | Admitting: Gastroenterology

## 2018-11-04 DIAGNOSIS — Z1211 Encounter for screening for malignant neoplasm of colon: Secondary | ICD-10-CM

## 2018-11-04 SURGERY — COLONOSCOPY WITH PROPOFOL
Anesthesia: General

## 2018-11-04 NOTE — Telephone Encounter (Signed)
Patients colonoscopy has been rescheduled to 11/16/18 with Dr. Bonna Gains.  I reviewed her instructions with her in detail reading her diet and rx instructions.  I asked her to call me back again closer to her procedure date so we can go over the instructions again making sure she understands them.

## 2018-11-04 NOTE — Telephone Encounter (Signed)
Patient advised to have COVID test on 11/11/18.  Thanks [employer]

## 2018-11-11 ENCOUNTER — Telehealth: Payer: Self-pay | Admitting: Gastroenterology

## 2018-11-11 ENCOUNTER — Other Ambulatory Visit: Payer: Medicaid Other

## 2018-11-11 NOTE — Telephone Encounter (Signed)
Rescheduled to 12/09/18. Due to transportation.  Thanks [employer]

## 2018-11-11 NOTE — Telephone Encounter (Signed)
Patient called & states she needs to cancel her covid test for 11-11-18 & procedure on 11-16-18 due to transportation. She needs a Tuesday of Wednesday.

## 2018-11-17 ENCOUNTER — Encounter: Payer: Self-pay | Admitting: Adult Health

## 2018-11-17 ENCOUNTER — Ambulatory Visit (INDEPENDENT_AMBULATORY_CARE_PROVIDER_SITE_OTHER): Payer: Medicaid Other | Admitting: Adult Health

## 2018-11-17 ENCOUNTER — Other Ambulatory Visit: Payer: Self-pay

## 2018-11-17 VITALS — BP 139/85 | HR 69 | Resp 16 | Ht 64.0 in | Wt 263.0 lb

## 2018-11-17 DIAGNOSIS — S60221S Contusion of right hand, sequela: Secondary | ICD-10-CM

## 2018-11-17 DIAGNOSIS — I10 Essential (primary) hypertension: Secondary | ICD-10-CM | POA: Diagnosis not present

## 2018-11-17 DIAGNOSIS — M25511 Pain in right shoulder: Secondary | ICD-10-CM | POA: Diagnosis not present

## 2018-11-17 DIAGNOSIS — G8929 Other chronic pain: Secondary | ICD-10-CM

## 2018-11-17 DIAGNOSIS — M25512 Pain in left shoulder: Secondary | ICD-10-CM

## 2018-11-17 DIAGNOSIS — M25552 Pain in left hip: Secondary | ICD-10-CM | POA: Diagnosis not present

## 2018-11-17 DIAGNOSIS — M545 Low back pain: Secondary | ICD-10-CM | POA: Diagnosis not present

## 2018-11-17 MED ORDER — TRAMADOL HCL 50 MG PO TABS: 100.0000 mg | ORAL_TABLET | Freq: Three times a day (TID) | ORAL | 0 refills | Status: DC | PRN

## 2018-11-17 NOTE — Progress Notes (Signed)
[facility] [address] [place], [place] [address]  Internal MEDICINE  Office Visit Note  Patient Name: Christina SongsterLisa M Richards  [id number]  [id number]  Date of Service: 11/17/2018  Chief Complaint  Patient presents with  . Medical Management of Chronic Issues    4 week follow up , tramadol refill , still having pain in hip referral needed for ortho , hand pain     HPI Pt is here for 4 week follow up. Pt has not been able to have her blood drawn yet.  She depends on other people for rides.  She has her mammogram scheduled, next week and she will get her blood drawn that day also.  She is here today reporting ongoing pain in her left hip. She would like a referral for ortho at this time. She also has a small place on her right hand that is swollen and painful.       Current Medication: Outpatient Encounter Medications as of 11/17/2018  Medication Sig  . ARIPiprazole ER (ABILIFY MAINTENA) 400 MG PRSY prefilled syringe Inject 400 mg into the muscle every 28 (twenty-eight) days.  . benztropine (COGENTIN) 2 MG tablet Take 2 mg by mouth 2 (two) times daily.  . citalopram (CELEXA) 20 MG tablet Take 20 mg by mouth daily.  Marland Kitchen. diltiazem (TIAZAC) 360 MG 24 hr capsule Take 1 capsule (360 mg total) by mouth daily.  . hydrOXYzine (ATARAX/VISTARIL) 50 MG tablet Take 50 mg by mouth 3 (three) times daily as needed.  . labetalol (NORMODYNE) 200 MG tablet Take 1 tablet (200 mg total) by mouth 2 (two) times daily.  . montelukast (SINGULAIR) 10 MG tablet Take 10 mg by mouth at bedtime.  Marland Kitchen. QUEtiapine (SEROQUEL) 100 MG tablet Take 100 mg by mouth at bedtime.  . traMADol (ULTRAM) 50 MG tablet Take 2 tablets (100 mg total) by mouth every 8 (eight) hours as needed.  . traZODone (DESYREL) 100 MG tablet Take 100 mg by mouth at bedtime. 3 tablets at night  . [DISCONTINUED] traMADol (ULTRAM) 50 MG tablet Take 2 tablets (100 mg total) by mouth every 8 (eight) hours as needed.   No facility-administered  encounter medications on file as of 11/17/2018.     Surgical History: Past Surgical History:  Procedure Laterality Date  . ECTOPIC PREGNANCY SURGERY    . ROTATOR CUFF REPAIR      Medical History: Past Medical History:  Diagnosis Date  . Allergy    Environmental  . Anemia   . Anxiety   . Coronary artery disease   . Depression   . GERD (gastroesophageal reflux disease)   . Hypercholesteremia   . Hypertension     Family History: History reviewed. No pertinent family history.  Social History   Socioeconomic History  . Marital status: Single    Spouse name: Not on file  . Number of children: Not on file  . Years of education: Not on file  . Highest education level: Not on file  Occupational History  . Not on file  Social Needs  . Financial resource strain: Not on file  . Food insecurity    Worry: Not on file    Inability: Not on file  . Transportation needs    Medical: Not on file    Non-medical: Not on file  Tobacco Use  . Smoking status: Light Tobacco Smoker  . Smokeless tobacco: Never Used  Substance and Sexual Activity  . Alcohol use: No    Frequency: Never  .  Drug use: Yes    Types: "Crack" cocaine, Amyl nitrate  . Sexual activity: Never  Lifestyle  . Physical activity    Days per week: Not on file    Minutes per session: Not on file  . Stress: Not on file  Relationships  . Social Musicianconnections    Talks on phone: Not on file    Gets together: Not on file    Attends religious service: Not on file    Active member of club or organization: Not on file    Attends meetings of clubs or organizations: Not on file    Relationship status: Not on file  . Intimate partner violence    Fear of current or ex partner: Not on file    Emotionally abused: Not on file    Physically abused: Not on file    Forced sexual activity: Not on file  Other Topics Concern  . Not on file  Social History Narrative  . Not on file      Review of Systems  Constitutional:  Negative for chills, fatigue and unexpected weight change.  HENT: Negative for congestion, rhinorrhea, sneezing and sore throat.   Eyes: Negative for photophobia, pain and redness.  Respiratory: Negative for cough, chest tightness and shortness of breath.   Cardiovascular: Negative for chest pain and palpitations.  Gastrointestinal: Negative for abdominal pain, constipation, diarrhea, nausea and vomiting.  Endocrine: Negative.   Genitourinary: Negative for dysuria and frequency.  Musculoskeletal: Negative for arthralgias, back pain, joint swelling and neck pain.  Skin: Negative for rash.  Allergic/Immunologic: Negative.   Neurological: Negative for tremors and numbness.  Hematological: Negative for adenopathy. Does not bruise/bleed easily.  Psychiatric/Behavioral: Negative for behavioral problems and sleep disturbance. The patient is not nervous/anxious.     Vital Signs: BP 139/85   Pulse 69   Resp 16   Ht 5\' 4"  (1.626 m)   Wt 263 lb (119.3 kg)   SpO2 98%   BMI 45.14 kg/m    Physical Exam Vitals signs and nursing note reviewed.  Constitutional:      General: She is not in acute distress.    Appearance: She is well-developed. She is not diaphoretic.  HENT:     Head: Normocephalic and atraumatic.     Mouth/Throat:     Pharynx: No oropharyngeal exudate.  Eyes:     Pupils: Pupils are equal, round, and reactive to light.  Neck:     Musculoskeletal: Normal range of motion and neck supple.     Thyroid: No thyromegaly.     Vascular: No JVD.     Trachea: No tracheal deviation.  Cardiovascular:     Rate and Rhythm: Normal rate and regular rhythm.     Heart sounds: Normal heart sounds. No murmur. No friction rub. No gallop.   Pulmonary:     Effort: Pulmonary effort is normal. No respiratory distress.     Breath sounds: Normal breath sounds. No wheezing or rales.  Chest:     Chest wall: No tenderness.  Abdominal:     Palpations: Abdomen is soft.     Tenderness: There is no  abdominal tenderness. There is no guarding.  Musculoskeletal: Normal range of motion.  Lymphadenopathy:     Cervical: No cervical adenopathy.  Skin:    General: Skin is warm and dry.  Neurological:     Mental Status: She is alert and oriented to person, place, and time.     Cranial Nerves: No cranial nerve deficit.  Psychiatric:  Behavior: Behavior normal.        Thought Content: Thought content normal.        Judgment: Judgment normal.    Assessment/Plan: 1. Chronic pain of both shoulders Reviewed risks and possible side effects associated with taking opiates, benzodiazepines and other CNS depressants. Combination of these could cause dizziness and drowsiness. Advised patient not to drive or operate machinery when taking these medications, as patient's and other's life can be at risk and will have consequences. Patient verbalized understanding in this matter. Dependence and abuse for these drugs will be monitored closely. A Controlled substance policy and procedure is on file which allows [facility] to order a urine drug screen test at any visit. Patient understands and agrees with the plan - traMADol (ULTRAM) 50 MG tablet; Take 2 tablets (100 mg total) by mouth every 8 (eight) hours as needed.  Dispense: 30 tablet; Refill: 0  2. Pain of left hip joint Ongoing pain in left hip, pt requesting to see ortho - Ambulatory referral to Orthopedic Surgery  3. Essential (primary) hypertension Stable, continue present management.   4. Chronic low back pain without sciatica, unspecified back pain laterality - traMADol (ULTRAM) 50 MG tablet; Take 2 tablets (100 mg total) by mouth every 8 (eight) hours as needed.  Dispense: 30 tablet; Refill: 0  5. Contusion of right hand, sequela Most likely will heal without intervention.  Already better than it was per patient.  Continue to monitor.  RTC if does not improve.   General Counseling: sylver vantassell understanding of the  findings of todays visit and agrees with plan of treatment. I have discussed any further diagnostic evaluation that may be needed or ordered today. We also reviewed her medications today. she has been encouraged to call the office with any questions or concerns that should arise related to todays visit.    No orders of the defined types were placed in this encounter.   Meds ordered this encounter  Medications  . traMADol (ULTRAM) 50 MG tablet    Sig: Take 2 tablets (100 mg total) by mouth every 8 (eight) hours as needed.    Dispense:  30 tablet    Refill:  0    Patient will need to be seen for further refills.    Time spent: 15 Minutes   This patient was seen by Orson Gear AGNP-C in Collaboration with Dr Lavera Guise as a part of collaborative care agreement     Kendell Bane AGNP-C Internal medicine

## 2018-12-06 ENCOUNTER — Other Ambulatory Visit: Payer: Self-pay

## 2018-12-06 ENCOUNTER — Other Ambulatory Visit
Admission: RE | Admit: 2018-12-06 | Discharge: 2018-12-06 | Disposition: A | Discharge status: Home / Self Care | Payer: Medicaid Other | Source: Ambulatory Visit | Attending: Gastroenterology | Admitting: Gastroenterology

## 2018-12-06 DIAGNOSIS — Z01812 Encounter for preprocedural laboratory examination: Secondary | ICD-10-CM | POA: Diagnosis present

## 2018-12-06 DIAGNOSIS — Z20828 Contact with and (suspected) exposure to other viral communicable diseases: Secondary | ICD-10-CM | POA: Insufficient documentation

## 2018-12-07 LAB — SARS CORONAVIRUS 2 (TAT 6-24 HRS): SARS Coronavirus 2: NEGATIVE

## 2018-12-07 LAB — FSH/LH
FSH: 35.9 m[IU]/mL
LH: 22 m[IU]/mL

## 2018-12-09 ENCOUNTER — Encounter: Payer: Self-pay | Admitting: *Deleted

## 2018-12-09 ENCOUNTER — Encounter
Admission: RE | Disposition: A | Discharge status: Home / Self Care | Payer: Self-pay | Source: Home / Self Care | Attending: Gastroenterology

## 2018-12-09 ENCOUNTER — Ambulatory Visit
Admission: RE | Admit: 2018-12-09 | Discharge: 2018-12-09 | Disposition: A | Discharge status: Home / Self Care | Payer: Medicaid Other | Attending: Gastroenterology | Admitting: Gastroenterology

## 2018-12-09 DIAGNOSIS — Z5309 Procedure and treatment not carried out because of other contraindication: Secondary | ICD-10-CM | POA: Insufficient documentation

## 2018-12-09 DIAGNOSIS — Z1211 Encounter for screening for malignant neoplasm of colon: Secondary | ICD-10-CM

## 2018-12-09 LAB — URINE DRUG SCREEN, QUALITATIVE (ARMC ONLY)
Amphetamines, Ur Screen: NOT DETECTED
Barbiturates, Ur Screen: NOT DETECTED
Benzodiazepine, Ur Scrn: NOT DETECTED
Cannabinoid 50 Ng, Ur ~~LOC~~: POSITIVE — AB
Cocaine Metabolite,Ur ~~LOC~~: POSITIVE — AB
MDMA (Ecstasy)Ur Screen: NOT DETECTED
Methadone Scn, Ur: NOT DETECTED
Opiate, Ur Screen: NOT DETECTED
Phencyclidine (PCP) Ur S: NOT DETECTED
Tricyclic, Ur Screen: NOT DETECTED

## 2018-12-09 SURGERY — COLONOSCOPY WITH PROPOFOL
Anesthesia: General

## 2018-12-09 MED ORDER — SODIUM CHLORIDE 0.9 % IV SOLN: INTRAVENOUS | Status: DC

## 2018-12-09 NOTE — Progress Notes (Signed)
Patient gives history of using cocaine on Tuesday. UDS ordered. Patient decided to cancel procedure, states "I know the UDS is going to be positive". Dr. Bonna Gains notified and explained to patient that the office will call her for a rescedule. I Advised patient about where to get help for drug addiction and to abstain for at least 5 days prior to procedures for safety. She agreed and was very pleasant.

## 2018-12-10 LAB — CBC WITH DIFFERENTIAL/PLATELET
Basophils Absolute: 0 10*3/uL (ref 0.0–0.2)
Basos: 1 %
EOS (ABSOLUTE): 0 10*3/uL (ref 0.0–0.4)
Eos: 1 %
Hematocrit: 41.6 % (ref 34.0–46.6)
Hemoglobin: 13.9 g/dL (ref 11.1–15.9)
Immature Grans (Abs): 0 10*3/uL (ref 0.0–0.1)
Immature Granulocytes: 0 %
Lymphocytes Absolute: 1.1 10*3/uL (ref 0.7–3.1)
Lymphs: 29 %
MCH: 29 pg (ref 26.6–33.0)
MCHC: 33.4 g/dL (ref 31.5–35.7)
MCV: 87 fL (ref 79–97)
Monocytes Absolute: 0.4 10*3/uL (ref 0.1–0.9)
Monocytes: 10 %
Neutrophils Absolute: 2.2 10*3/uL (ref 1.4–7.0)
Neutrophils: 59 %
Platelets: 219 10*3/uL (ref 150–450)
RBC: 4.8 x10E6/uL (ref 3.77–5.28)
RDW: 13.7 % (ref 11.7–15.4)
WBC: 3.7 10*3/uL (ref 3.4–10.8)

## 2018-12-10 LAB — B12 AND FOLATE PANEL
Folate: 5.2 ng/mL (ref >3.0)
Vitamin B-12: 334 pg/mL (ref 232–1245)

## 2018-12-10 LAB — IRON,TIBC AND FERRITIN PANEL
Ferritin: 176 ng/mL — ABNORMAL HIGH (ref 15–150)
Iron Saturation: 14 % — ABNORMAL LOW (ref 15–55)
Iron: 45 ug/dL (ref 27–159)
Total Iron Binding Capacity: 324 ug/dL (ref 250–450)
UIBC: 279 ug/dL (ref 131–425)

## 2018-12-10 LAB — LIPID PANEL WITH LDL/HDL RATIO
Cholesterol, Total: 230 mg/dL — ABNORMAL HIGH (ref 100–199)
HDL: 67 mg/dL (ref >39)
LDL Calculated: 135 mg/dL — ABNORMAL HIGH (ref 0–99)
LDl/HDL Ratio: 2 ratio (ref 0.0–3.2)
Triglycerides: 140 mg/dL (ref 0–149)
VLDL Cholesterol Cal: 28 mg/dL (ref 5–40)

## 2018-12-10 LAB — T4, FREE: Free T4: 1.17 ng/dL (ref 0.82–1.77)

## 2018-12-10 LAB — COMPREHENSIVE METABOLIC PANEL
ALT: 8 IU/L (ref 0–32)
AST: 15 IU/L (ref 0–40)
Albumin/Globulin Ratio: 1.4 (ref 1.2–2.2)
Albumin: 4 g/dL (ref 3.8–4.9)
Alkaline Phosphatase: 61 IU/L (ref 39–117)
BUN/Creatinine Ratio: 11 (ref 9–23)
BUN: 12 mg/dL (ref 6–24)
Bilirubin Total: 0.6 mg/dL (ref 0.0–1.2)
CO2: 25 mmol/L (ref 20–29)
Calcium: 9 mg/dL (ref 8.7–10.2)
Chloride: 102 mmol/L (ref 96–106)
Creatinine, Ser: 1.05 mg/dL — ABNORMAL HIGH (ref 0.57–1.00)
GFR calc Af Amer: 67 mL/min/{1.73_m2} (ref >59)
GFR calc non Af Amer: 58 mL/min/{1.73_m2} — ABNORMAL LOW (ref >59)
Globulin, Total: 2.8 g/dL (ref 1.5–4.5)
Glucose: 102 mg/dL — ABNORMAL HIGH (ref 65–99)
Potassium: 4.1 mmol/L (ref 3.5–5.2)
Sodium: 141 mmol/L (ref 134–144)
Total Protein: 6.8 g/dL (ref 6.0–8.5)

## 2018-12-10 LAB — VITAMIN D 1,25 DIHYDROXY
Vitamin D 1, 25 (OH)2 Total: 71 pg/mL — ABNORMAL HIGH
Vitamin D2 1, 25 (OH)2: <10 pg/mL
Vitamin D3 1, 25 (OH)2: 71 pg/mL

## 2018-12-10 LAB — TSH: TSH: 1.11 u[IU]/mL (ref 0.450–4.500)

## 2018-12-20 ENCOUNTER — Telehealth: Payer: Self-pay

## 2018-12-20 NOTE — Telephone Encounter (Signed)
-----   Message from Virgel Manifold, MD sent at 12/09/2018  9:16 AM EDT ----- Reschedule colonoscopy. Pt reports cocaine use. Please advise her to not to use atleast 1 week prior to her procedure next time.

## 2018-12-20 NOTE — Telephone Encounter (Signed)
LMTCO.

## 2019-01-03 ENCOUNTER — Ambulatory Visit: Payer: Self-pay | Admitting: Nurse Practitioner

## 2019-01-09 ENCOUNTER — Telehealth: Payer: Self-pay

## 2019-01-09 NOTE — Telephone Encounter (Signed)
Patient has been contacted in regards to rescheduling her colonoscopy (Canceled due to drug use).  She has declined to reschedule at this time, states she has too many other appts.  Thanks [employer]

## 2019-01-18 ENCOUNTER — Ambulatory Visit: Payer: Self-pay | Admitting: Nurse Practitioner

## 2019-01-19 ENCOUNTER — Other Ambulatory Visit: Payer: Self-pay

## 2019-01-19 ENCOUNTER — Ambulatory Visit (INDEPENDENT_AMBULATORY_CARE_PROVIDER_SITE_OTHER): Payer: Medicaid Other | Admitting: Nurse Practitioner

## 2019-01-19 ENCOUNTER — Encounter: Payer: Self-pay | Admitting: Nurse Practitioner

## 2019-01-19 VITALS — BP 138/92 | HR 65 | Temp 97.3°F | Resp 16 | Ht 64.0 in | Wt 261.0 lb

## 2019-01-19 DIAGNOSIS — I10 Essential (primary) hypertension: Secondary | ICD-10-CM

## 2019-01-19 DIAGNOSIS — F1420 Cocaine dependence, uncomplicated: Secondary | ICD-10-CM | POA: Diagnosis not present

## 2019-01-19 DIAGNOSIS — F321 Major depressive disorder, single episode, moderate: Secondary | ICD-10-CM

## 2019-01-19 DIAGNOSIS — E785 Hyperlipidemia, unspecified: Secondary | ICD-10-CM | POA: Insufficient documentation

## 2019-01-19 NOTE — Progress Notes (Signed)
Christina Richards ([place]) [place], [place] [id number]  Internal MEDICINE  Office Visit Note  Patient Name: Christina Richards  [id number]  [id number]  Date of Service: 01/19/2019  Chief Complaint  Patient presents with  . Follow-up    labs  . Hypertension  . Hyperlipidemia  . Gastroesophageal Reflux  . Hip Pain    radiates down to leg   . Leg Pain    hurts her foot as well and her left foot hurts to walk on  . Depression    suicidal thoughts, acted on a plan once, but stopped by a friend    The patient is here for routine follow up. She is here to follow up regarding her labs. Her LDL and total cholesterol were both elevated mildly. Ferritin was mildly elevated as well. She does take an iron supplement every day. She takes this because she donates plasma regularly and this helps to build her blood levels back up. She is a smoker. She used to smoke about a pack of cigarettes per day. She has cut own significantly for this.  The patient had been scheduled to have screening colonoscopy. This was cancelled because she had a drug screen which was positive for cocaine. She used to see Dr. Holley Richards, psychiatry, for severe depression and anxiety. She states that when she saw Dr. Holley Richards, he had her completely clean and sober. She states that she uses cocaine to self medicate for severe mental health issues. Has been diagnosed with schizoid and bipolar disorder. He has since left the program she was attending. She continues to go to [facility] where he worked. She gets abilify injections every month. She also takes citalopram. She takes trazodone at night to help with sleep.  She is asking for prescription for tramadol. She has bilateral shoulder pain and low back pain and hip pain. She has taken tramadol off and on for this. Helps when she takes it. Discussed inability to prescribed any controlled medications while her urine drug screen is positive for cocaine. She voiced understanding.        Current Medication: Outpatient Encounter Medications as of 01/19/2019  Medication Sig  . ARIPiprazole ER (ABILIFY MAINTENA) 400 MG PRSY prefilled syringe Inject 400 mg into the muscle every 28 (twenty-eight) days.  . benztropine (COGENTIN) 2 MG tablet Take 2 mg by mouth 2 (two) times daily.  . citalopram (CELEXA) 20 MG tablet Take 20 mg by mouth daily.  Marland Kitchen diltiazem (TIAZAC) 360 MG 24 hr capsule Take 1 capsule (360 mg total) by mouth daily.  . hydrOXYzine (ATARAX/VISTARIL) 50 MG tablet Take 50 mg by mouth 3 (three) times daily as needed.  . labetalol (NORMODYNE) 200 MG tablet Take 1 tablet (200 mg total) by mouth 2 (two) times daily.  . montelukast (SINGULAIR) 10 MG tablet Take 10 mg by mouth at bedtime.  Marland Kitchen QUEtiapine (SEROQUEL) 100 MG tablet Take 100 mg by mouth at bedtime.  . traMADol (ULTRAM) 50 MG tablet Take 2 tablets (100 mg total) by mouth every 8 (eight) hours as needed.  . traZODone (DESYREL) 100 MG tablet Take 100 mg by mouth at bedtime. 3 tablets at night   No facility-administered encounter medications on file as of 01/19/2019.     Surgical History: Past Surgical History:  Procedure Laterality Date  . ECTOPIC PREGNANCY SURGERY    . ROTATOR CUFF REPAIR      Medical History: Past Medical History:  Diagnosis Date  . Allergy    Environmental  .  Anemia   . Anxiety   . Coronary artery disease   . Depression   . GERD (gastroesophageal reflux disease)   . Hypercholesteremia   . Hypertension     Family History: History reviewed. No pertinent family history.  Social History   Socioeconomic History  . Marital status: Single    Spouse name: Not on file  . Number of children: Not on file  . Years of education: Not on file  . Highest education level: Not on file  Occupational History  . Not on file  Social Needs  . Financial resource strain: Not on file  . Food insecurity    Worry: Not on file    Inability: Not on file  . Transportation needs     Medical: Not on file    Non-medical: Not on file  Tobacco Use  . Smoking status: Light Tobacco Smoker  . Smokeless tobacco: Never Used  Substance and Sexual Activity  . Alcohol use: No    Frequency: Never  . Drug use: Not Currently  . Sexual activity: Never  Lifestyle  . Physical activity    Days per week: Not on file    Minutes per session: Not on file  . Stress: Not on file  Relationships  . Social Musician on phone: Not on file    Gets together: Not on file    Attends religious service: Not on file    Active member of club or organization: Not on file    Attends meetings of clubs or organizations: Not on file    Relationship status: Not on file  . Intimate partner violence    Fear of current or ex partner: Not on file    Emotionally abused: Not on file    Physically abused: Not on file    Forced sexual activity: Not on file  Other Topics Concern  . Not on file  Social History Narrative  . Not on file      Review of Systems  Constitutional: Negative for chills, fatigue and unexpected weight change.  HENT: Negative for congestion, rhinorrhea, sneezing and sore throat.   Respiratory: Negative for cough, chest tightness, shortness of breath and wheezing.   Cardiovascular: Negative for chest pain and palpitations.       Elevated blood pressure.  Gastrointestinal: Negative for abdominal pain, constipation, diarrhea, nausea and vomiting.  Endocrine: Negative for cold intolerance, heat intolerance, polydipsia and polyuria.  Genitourinary: Negative for dysuria.  Musculoskeletal: Positive for arthralgias and myalgias. Negative for back pain, joint swelling and neck pain.       Bilateral shoulder (ain, low back pain, and left hip pain.   Skin: Negative for rash.  Allergic/Immunologic: Negative.   Neurological: Negative for dizziness, tremors, numbness and headaches.  Hematological: Negative for adenopathy. Does not bruise/bleed easily.  Psychiatric/Behavioral:  Positive for dysphoric mood. Negative for behavioral problems and sleep disturbance. The patient is nervous/anxious.    Today's Vitals   01/19/19 0853  BP: (!) 138/92  Pulse: 65  Resp: 16  Temp: (!) 97.3 F (36.3 C)  SpO2: 99%  Weight: 261 lb (118.4 kg)  Height: 5\' 4"  (1.626 m)   Body mass index is 44.8 kg/m.  Physical Exam Vitals signs and nursing note reviewed.  Constitutional:      Appearance: Normal appearance.  HENT:     Head: Normocephalic and atraumatic.  Eyes:     Pupils: Pupils are equal, round, and reactive to light.  Neck:  Musculoskeletal: Normal range of motion and neck supple.  Cardiovascular:     Rate and Rhythm: Normal rate and regular rhythm.     Heart sounds: Normal heart sounds.  Pulmonary:     Effort: Pulmonary effort is normal.     Breath sounds: Normal breath sounds.  Skin:    General: Skin is warm and dry.  Neurological:     Mental Status: She is alert and oriented to person, place, and time.  Psychiatric:        Attention and Perception: Attention and perception normal.        Mood and Affect: Mood is depressed.        Speech: Speech normal.        Behavior: Behavior normal.        Cognition and Memory: Cognition and memory normal.    Assessment/Plan: 1. Essential (primary) hypertension Generally stable. Continue bp medication as prescribed.  2. Hyperlipidemia, unspecified hyperlipidemia type Reviewed lipid panel with patient. Encouraged her to limit fried and fatty foods in the diet and increase routine exercise.   3. Depression, major, single episode, moderate (HCC) Will refer to [facility] for further evaluation and treatment. Advised her to continue with current treatment until established with new provider.  - Ambulatory referral to Psychiatry  4. Cocaine dependence without complication (HCC) Discussed risks, especially cardiac risks associated with using cocaine. Will try to get her set up with new psychiatric provider to help get her  off this completely.   General Counseling: Christina EpleyLisa verbalizes understanding of the findings of todays visit and agrees with plan of treatment. I have discussed any further diagnostic evaluation that may be needed or ordered today. We also reviewed her medications today. she has been encouraged to call the office with any questions or concerns that should arise related to todays visit.  Hypertension Counseling:   The following hypertensive lifestyle modification were recommended and discussed:  1. Limiting alcohol intake to less than 1 oz/day of ethanol:(24 oz of beer or 8 oz of wine or 2 oz of 100-proof whiskey). 2. Take baby ASA 81 mg daily. 3. Importance of regular aerobic exercise and losing weight. 4. Reduce dietary saturated fat and cholesterol intake for overall cardiovascular health. 5. Maintaining adequate dietary potassium, calcium, and magnesium intake. 6. Regular monitoring of the blood pressure. 7. Reduce sodium intake to less than 100 mmol/day (less than 2.3 gm of sodium or less than 6 gm of sodium choride)   This patient was seen by Vincent GrosHeather Shawntavia Saunders FNP Collaboration with Dr Lyndon CodeFozia M Khan as a part of collaborative care agreement  Orders Placed This Encounter  Procedures  . Ambulatory referral to Psychiatry     Time spent: 7730 Minutes      Dr Lyndon CodeFozia M Khan Internal medicine

## 2019-02-14 ENCOUNTER — Other Ambulatory Visit: Payer: Self-pay | Admitting: Podiatry

## 2019-02-14 ENCOUNTER — Ambulatory Visit: Payer: Medicaid Other

## 2019-02-14 ENCOUNTER — Encounter: Payer: Self-pay | Admitting: Podiatry

## 2019-02-14 ENCOUNTER — Ambulatory Visit (INDEPENDENT_AMBULATORY_CARE_PROVIDER_SITE_OTHER): Payer: Medicaid Other | Admitting: Podiatry

## 2019-02-14 ENCOUNTER — Ambulatory Visit (INDEPENDENT_AMBULATORY_CARE_PROVIDER_SITE_OTHER): Payer: Medicaid Other

## 2019-02-14 ENCOUNTER — Other Ambulatory Visit: Payer: Self-pay

## 2019-02-14 VITALS — BP 177/105 | HR 71

## 2019-02-14 DIAGNOSIS — M19072 Primary osteoarthritis, left ankle and foot: Secondary | ICD-10-CM

## 2019-02-14 DIAGNOSIS — M2141 Flat foot [pes planus] (acquired), right foot: Secondary | ICD-10-CM

## 2019-02-14 DIAGNOSIS — L600 Ingrowing nail: Secondary | ICD-10-CM

## 2019-02-14 DIAGNOSIS — M76821 Posterior tibial tendinitis, right leg: Secondary | ICD-10-CM

## 2019-02-14 DIAGNOSIS — M2142 Flat foot [pes planus] (acquired), left foot: Secondary | ICD-10-CM | POA: Diagnosis not present

## 2019-02-14 DIAGNOSIS — M21611 Bunion of right foot: Secondary | ICD-10-CM

## 2019-02-14 DIAGNOSIS — M722 Plantar fascial fibromatosis: Secondary | ICD-10-CM

## 2019-02-14 DIAGNOSIS — M76822 Posterior tibial tendinitis, left leg: Secondary | ICD-10-CM | POA: Diagnosis not present

## 2019-02-14 DIAGNOSIS — M21612 Bunion of left foot: Secondary | ICD-10-CM | POA: Diagnosis not present

## 2019-02-14 DIAGNOSIS — M19071 Primary osteoarthritis, right ankle and foot: Secondary | ICD-10-CM

## 2019-02-14 MED ORDER — MELOXICAM 7.5 MG PO TABS: 7.5000 mg | ORAL_TABLET | Freq: Every day | ORAL | 0 refills | Status: DC

## 2019-02-15 ENCOUNTER — Encounter: Payer: Self-pay | Admitting: Podiatry

## 2019-02-15 NOTE — Progress Notes (Signed)
Subjective:  Patient ID: Christina Richards, female    DOB: 1961/02/14,  MRN: [id number]  Chief Complaint  Patient presents with   Foot Pain    B/L feet are painful since 66 years    58 y.o. female presents with the above complaint.  She states her multiple poor blood flow person clearing the bilateral bunions with MPJ arthritis.  She also has bilateral hindfoot pain.  She is also complaining of ingrowing of both of her hallux.  She states that she has rigid pes planus deformity.  Her deformity deformity is bilateral.  Her pes planus deformity is the driving factor for most of her complaints on the foot.  She states that the pain has been around for 20 years has been gradual in onset but now it has gotten worse.  She has not tried any over-the-counter orthotics or any other modalities of treatment.  She denies any nausea fever chills or vomiting.  She ambulates in worn-out sneakers.   Review of Systems: Negative except as noted in the HPI. Denies N/V/F/Ch.  Past Medical History:  Diagnosis Date   Allergy    Environmental   Anemia    Anxiety    Coronary artery disease    Depression    GERD (gastroesophageal reflux disease)    Hypercholesteremia    Hypertension     Current Outpatient Medications:    ARIPiprazole ER (ABILIFY MAINTENA) 400 MG PRSY prefilled syringe, Inject 400 mg into the muscle every 28 (twenty-eight) days., Disp: , Rfl:    benztropine (COGENTIN) 2 MG tablet, Take 2 mg by mouth 2 (two) times daily., Disp: , Rfl:    citalopram (CELEXA) 20 MG tablet, Take 20 mg by mouth daily., Disp: , Rfl:    diltiazem (TIAZAC) 360 MG 24 hr capsule, Take 1 capsule (360 mg total) by mouth daily., Disp: 90 capsule, Rfl: 2   hydrOXYzine (ATARAX/VISTARIL) 50 MG tablet, Take 50 mg by mouth 3 (three) times daily as needed., Disp: , Rfl:    labetalol (NORMODYNE) 200 MG tablet, Take 1 tablet (200 mg total) by mouth 2 (two) times daily., Disp: 180 tablet, Rfl: 2   montelukast  (SINGULAIR) 10 MG tablet, Take 10 mg by mouth at bedtime., Disp: , Rfl:    QUEtiapine (SEROQUEL) 100 MG tablet, Take 100 mg by mouth at bedtime., Disp: , Rfl:    traMADol (ULTRAM) 50 MG tablet, Take 2 tablets (100 mg total) by mouth every 8 (eight) hours as needed., Disp: 30 tablet, Rfl: 0   traZODone (DESYREL) 100 MG tablet, Take 100 mg by mouth at bedtime. 3 tablets at night, Disp: , Rfl:    meloxicam (MOBIC) 7.5 MG tablet, Take 1 tablet (7.5 mg total) by mouth daily., Disp: 30 tablet, Rfl: 0  Social History   Tobacco Use  Smoking Status Light Tobacco Smoker  Smokeless Tobacco Never Used    Allergies  Allergen Reactions   Ibuprofen Nausea Only   Objective:   Vitals:   02/14/19 1109  BP: (!) 177/105  Pulse: 71   There is no height or weight on file to calculate BMI. Constitutional Well developed. Well nourished.  Vascular Dorsalis pedis pulses palpable bilaterally. Posterior tibial pulses palpable bilaterally. Capillary refill normal to all digits.  No cyanosis or clubbing noted. Pedal hair growth normal.  Neurologic Normal speech. Oriented to person, place, and time. Epicritic sensation to light touch grossly present bilaterally.  Dermatologic Nails well groomed and normal in appearance. No open wounds. No skin lesions.  Orthopedic:  Pain on palpation to the posterior tibial tendon insertions bilaterally.  Pain with eversion and inversion of the foot active and passive.  Exostosis of the navicular was palpated with pain.  No pain along the posterior tibial tendon only at the insertion.  Pain with range of motion active and passive first MPJ bilaterally.  Very limited motion at the first MPJ bilaterally.  Dorsal prominence noted at the first MPJ bilaterally hallux deviation noted bilaterally.  Pain on palpation proximal to the entire nail plate of the hallux bilaterally.  Both of the toenails are severely ingrown medially and laterally.  Upon gait examination severe  pes planus deformity noted unable to perform single and double heel raises.  Calcaneus eversion noted bilaterally.  To many toes sign is noted bilaterally.   Radiographs: 3 views of skeletally mature adult foot bilateral feet.  Severe osteoarthritic changes noted worst MPJ with osteophytes bilaterally.  There are multiple ossicles/osteophytes noted at medial aspect of the navicular likely in the posterior tibial tendon.  There is a decrease in calcaneal inclination angle increase in talar declination angle increase in Mary's angle.  These findings are consistent with pes planus deformity.  There is midfoot arthritis noted bilaterally. Assessment:   1. Posterior tibial tendinitis, left   2. Posterior tibial tendinitis of right leg   3. Arthritis of first metatarsophalangeal (MTP) joint of right foot   4. Arthritis of first metatarsophalangeal (MTP) joint of left foot   5. Ingrown toenail of right foot   6. Ingrown left big toenail   7. Pes planus of both feet    Plan:  Patient was evaluated and treated and all questions answered.  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  However, patient and I have discussed surgical interventions given the amount of osteoarthritic changes and the amount of pain that the patient is experiencing while ambulating.  I will discuss further during next visit. -A steroid injection was performed at bilateral first MPJ using 1% plain Lidocaine and 10 mg of Kenalog. This was well tolerated.  Bilateral posterior tibial tendinitis -Given the underlying pes planus deformity, the amount of stress and pain on the posterior tibial tendon and at the insertion, patient will benefit from orthotic intervention to help control the hindfoot motion.  However given that, patient also has ossicle/osteophytes on  x-ray at the navicular base patient may need surgical intervention with some debridement prior to orthotic intervention.  I will discuss this at next visit as well -A steroid injection was performed at medial aspect of the navicular not within the tendon using 1% plain Lidocaine and 10 mg of Kenalog. This was well tolerated.   Bilateral painful ingrown hallux both borders and top of the nail -Patient will benefit from a total nail avulsion not permanent.  I educated the patient that the pain will come back given that this is not a permanent procedure.  She understood the risk and has elected to undergo nonpermanent total nail avulsion.  Procedure: Excision of total  Toenail bilaterally Location: Bilateral 1st toe total nail borders. Anesthesia: Lidocaine 1% plain; 1.5 mL and Marcaine 0.5% plain; 1.5 mL, digital block. Skin Prep: Betadine. Dressing: Silvadene; telfa; dry, sterile, compression dressing. Technique: Following skin prep, the toe was exsanguinated and a tourniquet was secured at the base of the toe. The affected nail border was freed, split with  a nail splitter, and excised. The tourniquet was then removed and sterile dressing applied. Disposition: Patient tolerated procedure well. Patient to return in 2 weeks for follow-up.   Pes planus deformity -Patient will benefit from custom-made orthotics by Raiford Nobleick. She has clinical findings of flatfoot deformity with too many so toe sign calcaneal eversion and unable to perform heel raises.  Radiographic signs are all consistent with pes planus deformity severe in nature as well. -She is scheduled to see rec for custom made orthotics.     No follow-ups on file.    No follow-ups on file.

## 2019-02-21 ENCOUNTER — Other Ambulatory Visit: Payer: Self-pay

## 2019-02-21 ENCOUNTER — Encounter: Payer: Self-pay | Admitting: Psychiatry

## 2019-02-21 ENCOUNTER — Encounter: Payer: Medicaid Other | Admitting: Psychiatry

## 2019-02-22 ENCOUNTER — Other Ambulatory Visit: Payer: Self-pay | Admitting: Adult Health

## 2019-02-22 ENCOUNTER — Other Ambulatory Visit: Payer: Medicaid Other | Admitting: Nurse Practitioner

## 2019-02-22 ENCOUNTER — Telehealth: Payer: Self-pay

## 2019-02-22 ENCOUNTER — Other Ambulatory Visit: Payer: Medicaid Other | Admitting: Orthotics

## 2019-02-22 ENCOUNTER — Other Ambulatory Visit: Payer: Self-pay | Admitting: Podiatry

## 2019-02-22 DIAGNOSIS — G8929 Other chronic pain: Secondary | ICD-10-CM

## 2019-02-22 DIAGNOSIS — M545 Low back pain, unspecified: Secondary | ICD-10-CM

## 2019-02-22 DIAGNOSIS — M25511 Pain in right shoulder: Secondary | ICD-10-CM

## 2019-02-22 NOTE — Telephone Encounter (Signed)
Patient called and stated that the toe you took the nail off of is red, swollen, very painful and draining.  She is requesting an antibiotic to cover any infection.  Please advise if I can send in an antibiotic for her.  She is also requesting a refill of Meloxicam which I will go ahead and refill.   Thanks!

## 2019-02-24 NOTE — Telephone Encounter (Signed)
Last appt 01/19/19 and next 03/28/19 and last med refill in 7/20 by adam

## 2019-03-14 ENCOUNTER — Ambulatory Visit: Payer: Medicaid Other | Admitting: Podiatry

## 2019-03-20 ENCOUNTER — Ambulatory Visit: Payer: Medicaid Other | Admitting: Podiatry

## 2019-03-28 ENCOUNTER — Other Ambulatory Visit: Payer: Medicaid Other | Admitting: Nurse Practitioner

## 2019-03-30 ENCOUNTER — Ambulatory Visit (INDEPENDENT_AMBULATORY_CARE_PROVIDER_SITE_OTHER): Payer: Medicaid Other | Admitting: Podiatry

## 2019-03-30 ENCOUNTER — Other Ambulatory Visit: Payer: Self-pay

## 2019-03-30 DIAGNOSIS — M19072 Primary osteoarthritis, left ankle and foot: Secondary | ICD-10-CM

## 2019-03-30 DIAGNOSIS — M76821 Posterior tibial tendinitis, right leg: Secondary | ICD-10-CM | POA: Diagnosis not present

## 2019-03-30 DIAGNOSIS — M2142 Flat foot [pes planus] (acquired), left foot: Secondary | ICD-10-CM | POA: Diagnosis not present

## 2019-03-30 DIAGNOSIS — M2141 Flat foot [pes planus] (acquired), right foot: Secondary | ICD-10-CM | POA: Diagnosis not present

## 2019-03-30 DIAGNOSIS — M19071 Primary osteoarthritis, right ankle and foot: Secondary | ICD-10-CM

## 2019-03-30 DIAGNOSIS — M76822 Posterior tibial tendinitis, left leg: Secondary | ICD-10-CM | POA: Diagnosis not present

## 2019-03-30 DIAGNOSIS — M79672 Pain in left foot: Secondary | ICD-10-CM

## 2019-03-30 MED ORDER — MELOXICAM 7.5 MG PO TABS: 7.5000 mg | ORAL_TABLET | Freq: Every day | ORAL | 0 refills | Status: DC

## 2019-03-31 ENCOUNTER — Encounter: Payer: Self-pay | Admitting: Podiatry

## 2019-03-31 NOTE — Progress Notes (Signed)
Subjective:  Patient ID: Christina Richards, female    DOB: [birth year],  MRN: [id number]  Chief Complaint  Patient presents with  . Nail Problem    pt is here for toenail fungus f/u, pt states that she is doing alot better since the last time but still has pain on the bottom of the left foot    58 y.o. female presents with the above complaint.  Patient is a follow-up from a by lateral toenail avulsion of the hallux.  She states that that is doing a lot better.  She does not have any complaints about that.  She is also here for a complaint on the pain of the left foot primarily which is much greater than the right foot.  She states that her posterior tibial tendon is still pretty aggravated even though injection helped it was just temporary.  She states that she has tried all the conservative therapy including different shoe gear modifications.  She states that she is getting orthotics as well.  The bilateral big toe arthritis has improved after the injection right much better than the left.  She states that there is still some pain on the left side but much more manageable after the injection.  She states that meloxicam has helped considerably.  She would like to consider surgical interventions at this time given the amount of pain that she is having in she has failed most conservative therapy.   Review of Systems: Negative except as noted in the HPI. Denies N/V/F/Ch.  Past Medical History:  Diagnosis Date  . Allergy    Environmental  . Anemia   . Anxiety   . Coronary artery disease   . Depression   . GERD (gastroesophageal reflux disease)   . History of borderline personality disorder   . Hypercholesteremia   . Hypertension     Current Outpatient Medications:  .  ARIPiprazole ER (ABILIFY MAINTENA) 400 MG PRSY prefilled syringe, Inject 400 mg into the muscle every 28 (twenty-eight) days., Disp: , Rfl:  .  diltiazem (TIAZAC) 360 MG 24 hr capsule, Take 1 capsule (360 mg total) by mouth daily.,  Disp: 90 capsule, Rfl: 2 .  labetalol (NORMODYNE) 200 MG tablet, Take 1 tablet (200 mg total) by mouth 2 (two) times daily., Disp: 180 tablet, Rfl: 2 .  meloxicam (MOBIC) 7.5 MG tablet, TAKE 1 TABLET BY MOUTH DAILY, Disp: 30 tablet, Rfl: 0 .  QUEtiapine (SEROQUEL) 100 MG tablet, Take 100 mg by mouth at bedtime., Disp: , Rfl:  .  traMADol (ULTRAM) 50 MG tablet, TAKE 2 TABLETS BY MOUTH EVERY 8 HOURS ASNEEDED., Disp: 30 tablet, Rfl: 0 .  traZODone (DESYREL) 100 MG tablet, Take 100 mg by mouth at bedtime. 3 tablets at night, Disp: , Rfl:  .  meloxicam (MOBIC) 7.5 MG tablet, Take 1 tablet (7.5 mg total) by mouth daily., Disp: 30 tablet, Rfl: 0  Social History   Tobacco Use  Smoking Status Light Tobacco Smoker  Smokeless Tobacco Never Used    Allergies  Allergen Reactions  . Ibuprofen Nausea Only   Objective:   There were no vitals filed for this visit. There is no height or weight on file to calculate BMI. Constitutional Well developed. Well nourished.  Vascular Dorsalis pedis pulses palpable bilaterally. Posterior tibial pulses palpable bilaterally. Capillary refill normal to all digits.  No cyanosis or clubbing noted. Pedal hair growth normal.  Neurologic Normal speech. Oriented to person, place, and time. Epicritic sensation to light touch grossly present bilaterally.  Dermatologic Nails well groomed and normal in appearance. No open wounds. No skin lesions.  Orthopedic:  Pain on palpation to the posterior tibial tendon insertions left greater than right.  Pain with eversion and inversion of the foot active and passive.  Exostosis of the navicular was palpated with pain.  Pain is on palpation to the insertion of the posterior tibial tendon.  No pain along the course of the tendon on palpation. Pain with range of motion active and passive first MPJ bilaterally.  Very limited motion at the first MPJ bilaterally.  Dorsal prominence noted at the first MPJ bilaterally hallux deviation  noted bilaterally.  No pain on bilateral hallux status post nail avulsion.  Upon gait examination severe pes planus deformity noted unable to perform single and double heel raises.  Calcaneus eversion noted bilaterally.  To many toes sign is noted bilaterally.   Radiographs: None Assessment:   1. Posterior tibial tendinitis, left   2. Posterior tibial tendinitis of right leg   3. Arthritis of first metatarsophalangeal (MTP) joint of right foot   4. Arthritis of first metatarsophalangeal (MTP) joint of left foot   5. Pes planus of both feet    Plan:  Patient was evaluated and treated and all questions answered.  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  I will hold off on steroid injection during this visit.   Bilateral posterior tibial tendinitis secondary to severe pes planus deformity -Given the underlying pes planus deformity, the amount of stress and pain on the posterior tibial tendon left greater than right and at the insertion, patient will benefit from orthotic intervention to help control the hindfoot motion.  However given that, patient also has ossicle/osteophytes on x-ray at the navicular base patient may need surgical intervention with some debridement prior to orthotic intervention.  I will discuss this further at next visit. -A steroid injection was performed at medial aspect of the navicular not within the tendon using 1% plain Lidocaine and 10 mg of Kenalog. This was well tolerated. -I will discuss obtaining an MRI during next visit to help evaluate the tendon itself. -I believe that patient's driving force of all the symptoms/pain is due to underlying flatfoot deformity.  I will spoke to patient about the pes planus deformity and she may need a pes planus reconstruction to help alleviate the insertional pain  at the posterior tibial tendon as well as pain from flatfoot. -Prior to discussing details of the pes planus reconstruction I would like to obtain an MRI.  I will obtain an MRI during next visit. -I have also discussed with the patient in terms of purchasing the orthotics.  I believe patient will benefit with orthotics after surgical intervention given the amount of pain that she is experiencing.  I discussed that with the patient and patient agrees that she will purchase the orthotics after surgical intervention and MRI.   Bilateral painful ingrown hallux both borders and top of the nail -Resolved      No follow-ups on file.    No follow-ups on file.

## 2019-04-25 ENCOUNTER — Ambulatory Visit: Payer: Medicaid Other | Admitting: Podiatry

## 2019-05-02 ENCOUNTER — Ambulatory Visit: Payer: Medicaid Other | Admitting: Podiatry

## 2019-05-04 ENCOUNTER — Other Ambulatory Visit: Payer: Medicaid Other | Admitting: Nurse Practitioner

## 2019-05-11 ENCOUNTER — Ambulatory Visit: Payer: Medicaid Other | Admitting: Podiatry

## 2019-05-15 ENCOUNTER — Telehealth: Payer: Self-pay | Admitting: Podiatry

## 2019-05-15 NOTE — Telephone Encounter (Signed)
Patient needs a letter, saying that she is unable to walk to the Mailbox, so she needs her mail delivered to her door .

## 2019-05-15 NOTE — Telephone Encounter (Signed)
That is fine with me.

## 2019-05-16 ENCOUNTER — Telehealth: Payer: Self-pay

## 2019-05-16 NOTE — Telephone Encounter (Signed)
Confirmed telephone appointment with patient. klh

## 2019-05-17 ENCOUNTER — Encounter: Payer: Self-pay | Admitting: Podiatry

## 2019-05-17 NOTE — Progress Notes (Signed)
Ok. I see her 05/19/2019

## 2019-05-19 ENCOUNTER — Other Ambulatory Visit: Payer: Self-pay

## 2019-05-19 ENCOUNTER — Ambulatory Visit: Payer: Medicaid Other | Admitting: Adult Health

## 2019-05-19 MED ORDER — LABETALOL HCL 200 MG PO TABS: 200.0000 mg | ORAL_TABLET | Freq: Two times a day (BID) | ORAL | 0 refills | Status: DC

## 2019-05-19 MED ORDER — DILTIAZEM HCL ER BEADS 360 MG PO CP24: 360.0000 mg | ORAL_CAPSULE | Freq: Every day | ORAL | 0 refills | Status: DC

## 2019-05-24 ENCOUNTER — Telehealth: Payer: Self-pay

## 2019-05-24 NOTE — Telephone Encounter (Signed)
Called lmom informing patient of virtual visit on 05/26/2019. klh °

## 2019-05-25 ENCOUNTER — Ambulatory Visit: Payer: Medicaid Other | Admitting: Podiatry

## 2019-05-26 ENCOUNTER — Encounter: Payer: Self-pay | Admitting: Adult Health

## 2019-05-26 ENCOUNTER — Ambulatory Visit (INDEPENDENT_AMBULATORY_CARE_PROVIDER_SITE_OTHER): Payer: Medicaid Other | Admitting: Adult Health

## 2019-05-26 VITALS — Resp 16 | Ht 64.0 in | Wt 260.0 lb

## 2019-05-26 DIAGNOSIS — I10 Essential (primary) hypertension: Secondary | ICD-10-CM

## 2019-05-26 DIAGNOSIS — M25552 Pain in left hip: Secondary | ICD-10-CM

## 2019-05-26 DIAGNOSIS — M25512 Pain in left shoulder: Secondary | ICD-10-CM

## 2019-05-26 DIAGNOSIS — F339 Major depressive disorder, recurrent, unspecified: Secondary | ICD-10-CM | POA: Diagnosis not present

## 2019-05-26 DIAGNOSIS — K219 Gastro-esophageal reflux disease without esophagitis: Secondary | ICD-10-CM

## 2019-05-26 DIAGNOSIS — E785 Hyperlipidemia, unspecified: Secondary | ICD-10-CM

## 2019-05-26 DIAGNOSIS — M25511 Pain in right shoulder: Secondary | ICD-10-CM

## 2019-05-26 DIAGNOSIS — G8929 Other chronic pain: Secondary | ICD-10-CM

## 2019-05-26 MED ORDER — DILTIAZEM HCL ER BEADS 360 MG PO CP24: 360.0000 mg | ORAL_CAPSULE | Freq: Every day | ORAL | 1 refills | Status: AC

## 2019-05-26 NOTE — Progress Notes (Signed)
[facility] [place], [place] [address]  Internal MEDICINE  Telephone Visit  Patient Name: Christina Richards  [id number]  [id number]  Date of Service: 05/26/2019  I connected with the patient at 1136 by telephone and verified the patients identity using two identifiers.   I discussed the limitations, risks, security and privacy concerns of performing an evaluation and management service by telephone and the availability of in person appointments. I also discussed with the patient that there may be a patient responsible charge related to the service.  The patient expressed understanding and agrees to proceed.    Chief Complaint  Patient presents with  . Telephone Assessment  . Telephone Screen  . Depression  . Gastroesophageal Reflux  . Hypertension  . Hyperlipidemia  . Knee Pain    sharp pain in right knee pressure at knee cap   . Shoulder Pain    left shoulder pain    HPI  Pt is seen via telephone. She is seen today for follow up on depression, GERD, HTN, and HLD.  She also is being seen by orthopedics for ongoing knee pain.  She has received and injection in her knee last week and is on naproxen.  She did not talk to them about the shoulder, but she has a history of rotator cuff tears.  She believes this may have worsened.     Current Medication: Outpatient Encounter Medications as of 05/26/2019  Medication Sig  . ARIPiprazole ER (ABILIFY MAINTENA) 400 MG PRSY prefilled syringe Inject 400 mg into the muscle every 28 (twenty-eight) days.  Marland Kitchen diltiazem (TIAZAC) 360 MG 24 hr capsule Take 1 capsule (360 mg total) by mouth daily.  Marland Kitchen labetalol (NORMODYNE) 200 MG tablet Take 1 tablet (200 mg total) by mouth 2 (two) times daily.  . meloxicam (MOBIC) 7.5 MG tablet TAKE 1 TABLET BY MOUTH DAILY  . meloxicam (MOBIC) 7.5 MG tablet Take 1 tablet (7.5 mg total) by mouth daily.  . QUEtiapine (SEROQUEL) 100 MG tablet Take 100 mg by mouth at bedtime.  . traMADol (ULTRAM) 50 MG  tablet TAKE 2 TABLETS BY MOUTH EVERY 8 HOURS ASNEEDED.  Marland Kitchen traZODone (DESYREL) 100 MG tablet Take 100 mg by mouth at bedtime. 3 tablets at night  . [DISCONTINUED] diltiazem (TIAZAC) 360 MG 24 hr capsule Take 1 capsule (360 mg total) by mouth daily.   No facility-administered encounter medications on file as of 05/26/2019.    Surgical History: Past Surgical History:  Procedure Laterality Date  . ECTOPIC PREGNANCY SURGERY    . ROTATOR CUFF REPAIR      Medical History: Past Medical History:  Diagnosis Date  . Allergy    Environmental  . Anemia   . Anxiety   . Coronary artery disease   . Depression   . GERD (gastroesophageal reflux disease)   . History of borderline personality disorder   . Hypercholesteremia   . Hypertension     Family History: Family History  Family history unknown: Yes    Social History   Socioeconomic History  . Marital status: Single    Spouse name: Not on file  . Number of children: 1  . Years of education: Not on file  . Highest education level: 10th grade  Occupational History  . Not on file  Tobacco Use  . Smoking status: Light Tobacco Smoker  . Smokeless tobacco: Never Used  Substance and Sexual Activity  . Alcohol use: No  . Drug use: Not Currently  . Sexual  activity: Never  Other Topics Concern  . Not on file  Social History Narrative  . Not on file   Social Determinants of Health   Financial Resource Strain: Low Risk   . Difficulty of Paying Living Expenses: Not very hard  Food Insecurity: Food Insecurity Present  . Worried About Programme researcher, broadcasting/film/video in the Last Year: Sometimes true  . Ran Out of Food in the Last Year: Sometimes true  Transportation Needs: Unmet Transportation Needs  . Lack of Transportation (Medical): Yes  . Lack of Transportation (Non-Medical): Yes  Physical Activity: Inactive  . Days of Exercise per Week: 0 days  . Minutes of Exercise per Session: 0 min  Stress: Stress Concern Present  . Feeling of Stress  : Very much  Social Connections: Unknown  . Frequency of Communication with Friends and Family: Not on file  . Frequency of Social Gatherings with Friends and Family: Not on file  . Attends Religious Services: Never  . Active Member of Clubs or Organizations: No  . Attends Banker Meetings: Never  . Marital Status: Never married  Intimate Partner Violence: Not At Risk  . Fear of Current or Ex-Partner: No  . Emotionally Abused: No  . Physically Abused: No  . Sexually Abused: No      Review of Systems  Constitutional: Negative for chills, fatigue and unexpected weight change.  HENT: Negative for congestion, rhinorrhea, sneezing and sore throat.   Eyes: Negative for photophobia, pain and redness.  Respiratory: Negative for cough, chest tightness and shortness of breath.   Cardiovascular: Negative for chest pain and palpitations.  Gastrointestinal: Negative for abdominal pain, constipation, diarrhea, nausea and vomiting.  Endocrine: Negative.   Genitourinary: Negative for dysuria and frequency.  Musculoskeletal: Negative for arthralgias, back pain, joint swelling and neck pain.  Skin: Negative for rash.  Allergic/Immunologic: Negative.   Neurological: Negative for tremors and numbness.  Hematological: Negative for adenopathy. Does not bruise/bleed easily.  Psychiatric/Behavioral: Negative for behavioral problems and sleep disturbance. The patient is not nervous/anxious.     Vital Signs: Resp 16   Ht 5\' 4"  (1.626 m)   Wt 260 lb (117.9 kg)   LMP 12/08/2012 Comment: no periods since" I was 39"  BMI 44.63 kg/m    Observation/Objective: Well sounding, NAD noted.   Assessment/Plan: 1. Essential (primary) hypertension Stable, continue present management.  refilled diltiazem.  - diltiazem (TIAZAC) 360 MG 24 hr capsule; Take 1 capsule (360 mg total) by mouth daily.  Dispense: 90 capsule; Refill: 1  2. Hyperlipidemia, unspecified hyperlipidemia type Stable,  continue to follow.  3. Gastroesophageal reflux disease without esophagitis No issues recently, managed with diet.  Continue to monitor.  4. Depression, recurrent (HCC) Pt seen 12/10/2012.  Continue to follow their recommendations and take medications as prescribed.   5. Chronic pain of both shoulders Ongoing pain, seeing ortho.     General Counseling: makhya arave understanding of the findings of today's phone visit and agrees with plan of treatment. I have discussed any further diagnostic evaluation that may be needed or ordered today. We also reviewed her medications today. she has been encouraged to call the office with any questions or concerns that should arise related to todays visit.    No orders of the defined types were placed in this encounter.   Meds ordered this encounter  Medications  . diltiazem (TIAZAC) 360 MG 24 hr capsule    Sig: Take 1 capsule (360 mg total) by mouth daily.  Dispense:  90 capsule    Refill:  1    Time spent: 25 Minutes    Blima Ledger AGNP-C Internal medicine

## 2019-06-08 ENCOUNTER — Other Ambulatory Visit: Payer: Medicaid Other | Admitting: Nurse Practitioner

## 2019-06-23 ENCOUNTER — Telehealth: Payer: Self-pay

## 2019-06-23 NOTE — Telephone Encounter (Signed)
CONFIRMED AND SCREENED FOR 06-27-19 OV. 

## 2019-06-27 ENCOUNTER — Ambulatory Visit (INDEPENDENT_AMBULATORY_CARE_PROVIDER_SITE_OTHER): Payer: Medicaid Other | Admitting: Nurse Practitioner

## 2019-06-27 ENCOUNTER — Other Ambulatory Visit: Payer: Self-pay

## 2019-06-27 ENCOUNTER — Encounter: Payer: Self-pay | Admitting: Nurse Practitioner

## 2019-06-27 VITALS — BP 128/83 | HR 67 | Temp 97.5°F | Resp 16 | Ht 64.0 in | Wt 267.8 lb

## 2019-06-27 DIAGNOSIS — Z0001 Encounter for general adult medical examination with abnormal findings: Secondary | ICD-10-CM

## 2019-06-27 DIAGNOSIS — R300 Dysuria: Secondary | ICD-10-CM

## 2019-06-27 DIAGNOSIS — N393 Stress incontinence (female) (male): Secondary | ICD-10-CM

## 2019-06-27 DIAGNOSIS — H6063 Unspecified chronic otitis externa, bilateral: Secondary | ICD-10-CM

## 2019-06-27 DIAGNOSIS — Z202 Contact with and (suspected) exposure to infections with a predominantly sexual mode of transmission: Secondary | ICD-10-CM

## 2019-06-27 DIAGNOSIS — I10 Essential (primary) hypertension: Secondary | ICD-10-CM

## 2019-06-27 DIAGNOSIS — Z124 Encounter for screening for malignant neoplasm of cervix: Secondary | ICD-10-CM

## 2019-06-27 DIAGNOSIS — K582 Mixed irritable bowel syndrome: Secondary | ICD-10-CM

## 2019-06-27 DIAGNOSIS — F1990 Other psychoactive substance use, unspecified, uncomplicated: Secondary | ICD-10-CM

## 2019-06-27 LAB — POCT URINALYSIS DIPSTICK
Bilirubin, UA: NEGATIVE
Blood, UA: NEGATIVE
Glucose, UA: NEGATIVE
Ketones, UA: NEGATIVE
Leukocytes, UA: NEGATIVE
Nitrite, UA: NEGATIVE
Protein, UA: POSITIVE — AB
Spec Grav, UA: >=1.03 — AB (ref 1.010–1.025)
Urobilinogen, UA: 0.2 E.U./dL
pH, UA: 5 (ref 5.0–8.0)

## 2019-06-27 MED ORDER — CIPROFLOXACIN-DEXAMETHASONE 0.3-0.1 % OT SUSP: 4.0000 [drp] | Freq: Two times a day (BID) | OTIC | 0 refills | Status: DC

## 2019-06-27 MED ORDER — LINACLOTIDE 145 MCG PO CAPS: 145.0000 ug | ORAL_CAPSULE | Freq: Every day | ORAL | 3 refills | Status: DC

## 2019-06-27 NOTE — Progress Notes (Addendum)
[facility] [address] [place], [place] [address]  Internal MEDICINE  Office Visit Note  Patient Name: Christina Richards  [id number]  [id number]  Date of Service: 06/28/2019   Pt is here for routine health maintenance examination   Chief Complaint  Patient presents with  . Annual Exam  . Hypertension  . Gastroesophageal Reflux  . Anxiety  . Anemia  . Allergies  . GI Problem    very little bowel movement   . Shortness of Breath    feels like she can breathe better after bowel movement   . Ear Problem    feels like it closes up on her   . Urinary Tract Infection     The patient is here for health maintenance exam and pap smear. She states that she has been having trouble with irritable bowel syndrome. She has more constipation than diarrhea. In the past, she has been on linzess, which did help her constipation. She does have history of internal and external hemorrhoids. Sometimes, she has noted bright blood red in the stool. She is unable to get colonoscopy due to illicit drug use. The las uds was positive for cocaine and THC. She continues to use drugs and is seeing psychiatry at [facility] .  She states that she is having trouble with ears. Right ear is more severe than left. Feels like there may be something in the ear. She has noted some pimples in the ear canal.  She would like to be tested for sexually transmitted infections today.   Current Medication: Outpatient Encounter Medications as of 06/27/2019  Medication Sig  . ARIPiprazole ER (ABILIFY MAINTENA) 400 MG PRSY prefilled syringe Inject 400 mg into the muscle every 28 (twenty-eight) days.  Marland Kitchen diltiazem (TIAZAC) 360 MG 24 hr capsule Take 1 capsule (360 mg total) by mouth daily.  Marland Kitchen labetalol (NORMODYNE) 200 MG tablet Take 1 tablet (200 mg total) by mouth 2 (two) times daily.  . meloxicam (MOBIC) 7.5 MG tablet TAKE 1 TABLET BY MOUTH DAILY  . meloxicam (MOBIC) 7.5 MG tablet Take 1 tablet (7.5 mg total) by  mouth daily.  . QUEtiapine (SEROQUEL) 100 MG tablet Take 100 mg by mouth at bedtime.  . traMADol (ULTRAM) 50 MG tablet TAKE 2 TABLETS BY MOUTH EVERY 8 HOURS ASNEEDED.  Marland Kitchen traZODone (DESYREL) 100 MG tablet Take 100 mg by mouth at bedtime. 3 tablets at night  . ciprofloxacin-dexamethasone (CIPRODEX) OTIC suspension Place 4 drops into both ears 2 (two) times daily.  Marland Kitchen linaclotide (LINZESS) 145 MCG CAPS capsule Take 1 capsule (145 mcg total) by mouth daily before breakfast.   No facility-administered encounter medications on file as of 06/27/2019.    Surgical History: Past Surgical History:  Procedure Laterality Date  . ECTOPIC PREGNANCY SURGERY    . ROTATOR CUFF REPAIR      Medical History: Past Medical History:  Diagnosis Date  . Allergy    Environmental  . Anemia   . Anxiety   . Coronary artery disease   . Depression   . GERD (gastroesophageal reflux disease)   . History of borderline personality disorder   . Hypercholesteremia   . Hypertension     Family History: Family History  Family history unknown: Yes      Review of Systems  Constitutional: Negative for activity change, chills, fatigue and unexpected weight change.  HENT: Positive for ear pain. Negative for congestion, rhinorrhea, sneezing and sore throat.        Ear pressure in  right ear. Feels like it is open and closed periodically.   Respiratory: Positive for wheezing. Negative for cough, chest tightness and shortness of breath.   Cardiovascular: Negative for chest pain and palpitations.  Gastrointestinal: Positive for constipation. Negative for abdominal pain, diarrhea, nausea and vomiting.  Endocrine: Negative for cold intolerance, heat intolerance, polydipsia and polyuria.  Genitourinary: Positive for dysuria.       Denies vaginal pain, itching, irritation, or vaginal discharge. Would like to be checked for sexually transmitted infections.  The patient continues to suffer from urinary incontinence. She does  require supplies for her incontinence.   Musculoskeletal: Negative for arthralgias, back pain, joint swelling, myalgias and neck pain.  Skin: Negative for rash.  Allergic/Immunologic: Negative for environmental allergies.  Neurological: Negative for dizziness, tremors, numbness and headaches.  Hematological: Negative for adenopathy. Does not bruise/bleed easily.  Psychiatric/Behavioral: Positive for dysphoric mood. Negative for behavioral problems and sleep disturbance. The patient is nervous/anxious.        Routinely sees psychiatry.    Today's Vitals   06/27/19 1411  BP: 128/83  Pulse: 67  Resp: 16  Temp: (!) 97.5 F (36.4 C)  SpO2: 96%  Weight: 267 lb 12.8 oz (121.5 kg)  Height: 5\' 4"  (1.626 m)   Body mass index is 45.97 kg/m.  Physical Exam Vitals and nursing note reviewed.  Constitutional:      General: She is not in acute distress.    Appearance: Normal appearance. She is well-developed. She is obese. She is not diaphoretic.  HENT:     Head: Normocephalic and atraumatic.     Right Ear: Tympanic membrane is erythematous.     Left Ear: Tympanic membrane is erythematous.     Nose: Nose normal.     Right Sinus: No maxillary sinus tenderness or frontal sinus tenderness.     Left Sinus: No maxillary sinus tenderness or frontal sinus tenderness.     Mouth/Throat:     Pharynx: No oropharyngeal exudate.  Eyes:     Pupils: Pupils are equal, round, and reactive to light.  Neck:     Thyroid: No thyromegaly.     Vascular: No carotid bruit or JVD.     Trachea: No tracheal deviation.  Cardiovascular:     Rate and Rhythm: Normal rate and regular rhythm.     Pulses: Normal pulses.     Heart sounds: Normal heart sounds. No murmur. No friction rub. No gallop.   Pulmonary:     Effort: Pulmonary effort is normal. No respiratory distress.     Breath sounds: Normal breath sounds. No wheezing or rales.  Chest:     Chest wall: No tenderness.  Abdominal:     General: Bowel sounds  are normal.     Palpations: Abdomen is soft.     Tenderness: There is no abdominal tenderness.  Genitourinary:    General: Normal vulva.     Exam position: Supine.     Labia:        Right: No tenderness.        Left: No tenderness.      Vagina: Vaginal discharge present. No erythema or tenderness.     Cervix: No discharge, friability or erythema.     Uterus: Normal.      Adnexa: Right adnexa normal and left adnexa normal.  Musculoskeletal:        General: Normal range of motion.     Cervical back: Normal range of motion and neck supple.  Lymphadenopathy:  Cervical: No cervical adenopathy.     Lower Body: No right inguinal adenopathy. No left inguinal adenopathy.  Skin:    General: Skin is warm and dry.  Neurological:     Mental Status: She is alert and oriented to person, place, and time.     Cranial Nerves: No cranial nerve deficit.  Psychiatric:        Attention and Perception: Attention and perception normal.        Mood and Affect: Mood and affect normal.        Speech: Speech normal.        Behavior: Behavior normal. Behavior is cooperative.        Thought Content: Thought content normal.        Cognition and Memory: Cognition and memory normal.        Judgment: Judgment normal.     LABS: Recent Results (from the past 2160 hour(s))  POCT Urinalysis Dipstick     Status: Abnormal   Collection Time: 06/27/19  2:25 PM  Result Value Ref Range   Color, UA     Clarity, UA     Glucose, UA Negative Negative   Bilirubin, UA negative    Ketones, UA negative    Spec Grav, UA >=1.030 (A) 1.010 - 1.025   Blood, UA negative    pH, UA 5.0 5.0 - 8.0   Protein, UA Positive (A) Negative    Comment: +30   Urobilinogen, UA 0.2 0.2 or 1.0 E.U./dL   Nitrite, UA negative    Leukocytes, UA Negative Negative   Appearance     Odor      Assessment/Plan: 1. Encounter for general adult medical examination with abnormal findings Annual health maintenance exam with pap smear today.  Lab slip given to check lab work.   2. Chronic otitis externa of both ears, unspecified type ciprodex ear drops. Insert four drops in both ears twice daily for next 7 days.  - ciprofloxacin-dexamethasone (CIPRODEX) OTIC suspension; Place 4 drops into both ears 2 (two) times daily.  Dispense: 7.5 mL; Refill: 0  3. Irritable bowel syndrome with both constipation and diarrhea May take linzess daily. Recommend proper fiber and water intake in diet to ease bowel movement. Order for Cologuard to be sent for colon cancer screening.  - linaclotide (LINZESS) 145 MCG CAPS capsule; Take 1 capsule (145 mcg total) by mouth daily before breakfast.  Dispense: 30 capsule; Refill: 3  4. Essential (primary) hypertension Stable. Continue bp medication as prescribed   5.Stress incontinence of urine Patient continues to have stress incontinence and continues to need incontinence supplies.   6. Exposure to STD Vaginal specimen taken to evaluate for sexually transmitted infections. Treat as indicated.  - NuSwab Vaginitis Plus (VG+)  7. Routine cervical smear - IGP, Aptima HPV  8. Illicit drug use Discussed intermittent drug use. She does see psychiatry and attending group sessions to help stop drug use.  9. Dysuria - POCT Urinalysis Dipstick  General Counseling: Adreonna verbalizes understanding of the findings of todays visit and agrees with plan of treatment. I have discussed any further diagnostic evaluation that may be needed or ordered today. We also reviewed her medications today. she has been encouraged to call the office with any questions or concerns that should arise related to todays visit.    Counseling:  This patient was seen by Vincent Gros FNP Collaboration with Dr Lyndon Code as a part of collaborative care agreement  Orders Placed This Encounter  Procedures  .  NuSwab Vaginitis Plus (VG+)  . POCT Urinalysis Dipstick    Meds ordered this encounter  Medications  .  linaclotide (LINZESS) 145 MCG CAPS capsule    Sig: Take 1 capsule (145 mcg total) by mouth daily before breakfast.    Dispense:  30 capsule    Refill:  3    Samples provided at today's visit    Order Specific Question:   Supervising Provider    Answer:   Lavera Guise [place]  . ciprofloxacin-dexamethasone (CIPRODEX) OTIC suspension    Sig: Place 4 drops into both ears 2 (two) times daily.    Dispense:  7.5 mL    Refill:  0    Order Specific Question:   Supervising Provider    Answer:   Lavera Guise [[id number]]    Total time spent: 56 Minutes  Time spent includes review of chart, medications, test results, and follow up plan with the patient.     Lavera Guise, MD  Internal Medicine

## 2019-06-28 DIAGNOSIS — R300 Dysuria: Secondary | ICD-10-CM | POA: Insufficient documentation

## 2019-06-28 DIAGNOSIS — K582 Mixed irritable bowel syndrome: Secondary | ICD-10-CM | POA: Insufficient documentation

## 2019-06-28 DIAGNOSIS — Z124 Encounter for screening for malignant neoplasm of cervix: Secondary | ICD-10-CM | POA: Insufficient documentation

## 2019-06-28 DIAGNOSIS — F1990 Other psychoactive substance use, unspecified, uncomplicated: Secondary | ICD-10-CM | POA: Insufficient documentation

## 2019-06-28 DIAGNOSIS — Z0001 Encounter for general adult medical examination with abnormal findings: Secondary | ICD-10-CM | POA: Insufficient documentation

## 2019-06-28 DIAGNOSIS — Z202 Contact with and (suspected) exposure to infections with a predominantly sexual mode of transmission: Secondary | ICD-10-CM | POA: Insufficient documentation

## 2019-06-28 DIAGNOSIS — H6063 Unspecified chronic otitis externa, bilateral: Secondary | ICD-10-CM | POA: Insufficient documentation

## 2019-06-29 NOTE — Progress Notes (Signed)
Waiting for specific testing results.

## 2019-06-30 ENCOUNTER — Telehealth: Payer: Self-pay

## 2019-06-30 ENCOUNTER — Other Ambulatory Visit: Payer: Self-pay | Admitting: Nurse Practitioner

## 2019-06-30 DIAGNOSIS — B9689 Other specified bacterial agents as the cause of diseases classified elsewhere: Secondary | ICD-10-CM

## 2019-06-30 LAB — NUSWAB VAGINITIS PLUS (VG+)
Atopobium vaginae: HIGH Score — AB
BVAB 2: HIGH Score — AB
Candida albicans, NAA: NEGATIVE
Candida glabrata, NAA: NEGATIVE
Chlamydia trachomatis, NAA: NEGATIVE
Megasphaera 1: HIGH Score — AB
Neisseria gonorrhoeae, NAA: NEGATIVE
Trich vag by NAA: NEGATIVE

## 2019-06-30 LAB — IGP, APTIMA HPV: HPV Aptima: NEGATIVE

## 2019-06-30 MED ORDER — METRONIDAZOLE 500 MG PO TABS: 500.0000 mg | ORAL_TABLET | Freq: Two times a day (BID) | ORAL | 0 refills | Status: DC

## 2019-06-30 NOTE — Progress Notes (Signed)
I'm sorry. Can you let her know that her pap smear was normal. Thanks.

## 2019-06-30 NOTE — Telephone Encounter (Signed)
-----   Message from Carlean Jews, NP sent at 06/30/2019 12:03 PM EST ----- Please let the patient know that Patient had bacterial vaginitis at time of physical. I have added metronidazole 500mg  twice daiy for next 7 days. I sent this to her pharmacy. She should not drink any alcohol while taking this medication. Screening f or other sexually transmitted infections, thus far, is negative. Thanks.

## 2019-06-30 NOTE — Progress Notes (Addendum)
Please let the patient know that Patient had bacterial vaginitis at time of physical. I have added metronidazole 500mg  twice daiy for next 7 days. I sent this to her pharmacy. She should not drink any alcohol while taking this medication. Screening for other sexually transmitted infections, thus far, is negative. Thanks.

## 2019-06-30 NOTE — Telephone Encounter (Signed)
Pt was notified.  

## 2019-06-30 NOTE — Telephone Encounter (Signed)
-----   Message from Carlean Jews, NP sent at 06/30/2019  2:46 PM EST ----- I'm sorry. Can you let her know that her pap smear was normal. Thanks.

## 2019-06-30 NOTE — Telephone Encounter (Signed)
Pt was notified. Pt also informed me that linzess is working for her and she wanted to let heather know to send it to the pharmacy. I informed pt that linzess was sent to her pharmacy at the time of the visit and she can pick it up when she goes to pick up her flagyl.

## 2019-06-30 NOTE — Progress Notes (Signed)
Patient with bacterial vaginitis at time of physical. I have added metronidazole 500mg  twice daiy for next 7 days. I sent this to her pharmacy. She should not drink any alcohol while taking this medication.

## 2019-07-27 ENCOUNTER — Other Ambulatory Visit: Payer: Self-pay

## 2019-07-27 ENCOUNTER — Ambulatory Visit: Payer: Medicaid Other | Admitting: Adult Health

## 2019-07-27 ENCOUNTER — Encounter: Payer: Self-pay | Admitting: Adult Health

## 2019-07-27 ENCOUNTER — Ambulatory Visit: Payer: Medicaid Other | Attending: Internal Medicine

## 2019-07-27 VITALS — Temp 100.6°F | Resp 16 | Ht 64.0 in | Wt 267.0 lb

## 2019-07-27 DIAGNOSIS — B373 Candidiasis of vulva and vagina: Secondary | ICD-10-CM

## 2019-07-27 DIAGNOSIS — J0110 Acute frontal sinusitis, unspecified: Secondary | ICD-10-CM | POA: Diagnosis not present

## 2019-07-27 DIAGNOSIS — B3731 Acute candidiasis of vulva and vagina: Secondary | ICD-10-CM

## 2019-07-27 MED ORDER — FLUCONAZOLE 150 MG PO TABS: 150.0000 mg | ORAL_TABLET | ORAL | 0 refills | Status: DC | PRN

## 2019-07-27 MED ORDER — AMOXICILLIN-POT CLAVULANATE 875-125 MG PO TABS: 1.0000 | ORAL_TABLET | Freq: Two times a day (BID) | ORAL | 0 refills | Status: DC

## 2019-07-27 NOTE — Progress Notes (Signed)
[facility] [address] [place], [place] [address]  Internal MEDICINE  Telephone Visit  Patient Name: Christina Richards  [id number]  [id number]  Date of Service: 07/27/2019  I connected with the patient at 354 by telephone and verified the patients identity using two identifiers.   I discussed the limitations, risks, security and privacy concerns of performing an evaluation and management service by telephone and the availability of in person appointments. I also discussed with the patient that there may be a patient responsible charge related to the service.  The patient expressed understanding and agrees to proceed.    Chief Complaint  Patient presents with  . Telephone Screen    sore throat, negative covid  . Telephone Assessment  . Fever    sweating    HPI  Pt is seen via telephone.  She reports she started feeling bad last week.  She reports today she feels like she has something in her throat.  She is constantly having to spit something out of that.  She is having Post nasal drip. She does have some fever and sweating, she is taking tylenol with some relief.       Current Medication: Outpatient Encounter Medications as of 07/27/2019  Medication Sig  . ARIPiprazole ER (ABILIFY MAINTENA) 400 MG PRSY prefilled syringe Inject 400 mg into the muscle every 28 (twenty-eight) days.  . ciprofloxacin-dexamethasone (CIPRODEX) OTIC suspension Place 4 drops into both ears 2 (two) times daily.  Marland Kitchen diltiazem (TIAZAC) 360 MG 24 hr capsule Take 1 capsule (360 mg total) by mouth daily.  Marland Kitchen labetalol (NORMODYNE) 200 MG tablet Take 1 tablet (200 mg total) by mouth 2 (two) times daily.  Marland Kitchen linaclotide (LINZESS) 145 MCG CAPS capsule Take 1 capsule (145 mcg total) by mouth daily before breakfast.  . meloxicam (MOBIC) 7.5 MG tablet TAKE 1 TABLET BY MOUTH DAILY  . meloxicam (MOBIC) 7.5 MG tablet Take 1 tablet (7.5 mg total) by mouth daily.  . metroNIDAZOLE (FLAGYL) 500 MG tablet Take 1 tablet  (500 mg total) by mouth 2 (two) times daily.  . QUEtiapine (SEROQUEL) 100 MG tablet Take 100 mg by mouth at bedtime.  . traMADol (ULTRAM) 50 MG tablet TAKE 2 TABLETS BY MOUTH EVERY 8 HOURS ASNEEDED.  Marland Kitchen traZODone (DESYREL) 100 MG tablet Take 100 mg by mouth at bedtime. 3 tablets at night  . amoxicillin-clavulanate (AUGMENTIN) 875-125 MG tablet Take 1 tablet by mouth 2 (two) times daily.  . fluconazole (DIFLUCAN) 150 MG tablet Take 1 tablet (150 mg total) by mouth every three (3) days as needed.   No facility-administered encounter medications on file as of 07/27/2019.    Surgical History: Past Surgical History:  Procedure Laterality Date  . ECTOPIC PREGNANCY SURGERY    . ROTATOR CUFF REPAIR      Medical History: Past Medical History:  Diagnosis Date  . Allergy    Environmental  . Anemia   . Anxiety   . Coronary artery disease   . Depression   . GERD (gastroesophageal reflux disease)   . History of borderline personality disorder   . Hypercholesteremia   . Hypertension     Family History: Family History  Family history unknown: Yes    Social History   Socioeconomic History  . Marital status: Single    Spouse name: Not on file  . Number of children: 1  . Years of education: Not on file  . Highest education level: 10th grade  Occupational History  . Not on  file  Tobacco Use  . Smoking status: Light Tobacco Smoker    Types: Cigarettes  . Smokeless tobacco: Never Used  . Tobacco comment: once in a while   Substance and Sexual Activity  . Alcohol use: No  . Drug use: Not Currently  . Sexual activity: Never  Other Topics Concern  . Not on file  Social History Narrative  . Not on file   Social Determinants of Health   Financial Resource Strain: Low Risk   . Difficulty of Paying Living Expenses: Not very hard  Food Insecurity: Food Insecurity Present  . Worried About Charity fundraiser in the Last Year: Sometimes true  . Ran Out of Food in the Last Year:  Sometimes true  Transportation Needs: Unmet Transportation Needs  . Lack of Transportation (Medical): Yes  . Lack of Transportation (Non-Medical): Yes  Physical Activity: Inactive  . Days of Exercise per Week: 0 days  . Minutes of Exercise per Session: 0 min  Stress: Stress Concern Present  . Feeling of Stress : Very much  Social Connections: Unknown  . Frequency of Communication with Friends and Family: Not on file  . Frequency of Social Gatherings with Friends and Family: Not on file  . Attends Religious Services: Never  . Active Member of Clubs or Organizations: No  . Attends Archivist Meetings: Never  . Marital Status: Never married  Intimate Partner Violence: Not At Risk  . Fear of Current or Ex-Partner: No  . Emotionally Abused: No  . Physically Abused: No  . Sexually Abused: No      Review of Systems  Vital Signs: Temp (!) 100.6 F (38.1 C)   Resp 16   Ht 5\' 4"  (1.626 m)   Wt 267 lb (121.1 kg)   LMP 12/08/2012 Comment: no periods since" I was 39"  BMI 45.83 kg/m    Observation/Objective: Well sounding, NAD noted    Assessment/Plan: 1. Acute non-recurrent frontal sinusitis Advised patient to take entire course of antibiotics as prescribed with food. Pt should return to clinic in 7-10 days if symptoms fail to improve or new symptoms develop.  - amoxicillin-clavulanate (AUGMENTIN) 875-125 MG tablet; Take 1 tablet by mouth 2 (two) times daily.  Dispense: 14 tablet; Refill: 0  2. Vaginal candida Use difulcan as needed. - fluconazole (DIFLUCAN) 150 MG tablet; Take 1 tablet (150 mg total) by mouth every three (3) days as needed.  Dispense: 3 tablet; Refill: 0  General Counseling: Christina Richards verbalizes understanding of the findings of today's phone visit and agrees with plan of treatment. I have discussed any further diagnostic evaluation that may be needed or ordered today. We also reviewed her medications today. she has been encouraged to call the office with  any questions or concerns that should arise related to todays visit.    No orders of the defined types were placed in this encounter.   Meds ordered this encounter  Medications  . amoxicillin-clavulanate (AUGMENTIN) 875-125 MG tablet    Sig: Take 1 tablet by mouth 2 (two) times daily.    Dispense:  14 tablet    Refill:  0  . fluconazole (DIFLUCAN) 150 MG tablet    Sig: Take 1 tablet (150 mg total) by mouth every three (3) days as needed.    Dispense:  3 tablet    Refill:  0    Patient may or may not want this RX, please offer it to her. She hung up before I could mention it.  Time spent: 20 Minutes    Blima Ledger [facility] Internal medicine

## 2019-08-25 ENCOUNTER — Telehealth: Payer: Self-pay

## 2019-08-25 NOTE — Telephone Encounter (Signed)
FAXED COLOGUARD FORM ON 08/24/19. 

## 2019-10-11 ENCOUNTER — Telehealth: Payer: Self-pay

## 2019-10-11 DIAGNOSIS — N393 Stress incontinence (female) (male): Secondary | ICD-10-CM | POA: Insufficient documentation

## 2019-10-11 NOTE — Telephone Encounter (Signed)
Incontinence order signed and faxed back to [facility] at [phone].

## 2019-10-24 ENCOUNTER — Telehealth: Payer: Self-pay

## 2019-10-24 NOTE — Telephone Encounter (Signed)
Patient number not in service unable to confirm and screen for 10-26-19 ov.

## 2019-10-26 ENCOUNTER — Other Ambulatory Visit: Payer: Self-pay

## 2019-10-26 ENCOUNTER — Ambulatory Visit (INDEPENDENT_AMBULATORY_CARE_PROVIDER_SITE_OTHER): Payer: Medicaid Other | Admitting: Nurse Practitioner

## 2019-10-26 ENCOUNTER — Encounter: Payer: Self-pay | Admitting: Nurse Practitioner

## 2019-10-26 VITALS — BP 113/66 | HR 67 | Temp 96.5°F | Resp 16 | Ht 64.0 in | Wt 258.2 lb

## 2019-10-26 DIAGNOSIS — N393 Stress incontinence (female) (male): Secondary | ICD-10-CM | POA: Diagnosis not present

## 2019-10-26 DIAGNOSIS — I10 Essential (primary) hypertension: Secondary | ICD-10-CM

## 2019-10-26 DIAGNOSIS — F339 Major depressive disorder, recurrent, unspecified: Secondary | ICD-10-CM

## 2019-10-26 DIAGNOSIS — Z1231 Encounter for screening mammogram for malignant neoplasm of breast: Secondary | ICD-10-CM | POA: Diagnosis not present

## 2019-10-26 NOTE — Progress Notes (Signed)
[facility] [address] [place], [place] [address]  Internal MEDICINE  Office Visit Note  Patient Name: Christina Richards  [id number]  [id number]  Date of Service: 11/01/2019  Chief Complaint  Patient presents with  . Follow-up  . Anemia  . Gastroesophageal Reflux  . Hypertension  . Hyperlipidemia  . Quality Metric Gaps    TDAP, Mammogram, Microalbumin    The patient is here for follow up visit. The patient states that she is doing well at this moment. She states that she continues to have urinary incontinence. She does rely on incontinence supplies for this. She is due to be scheduled for her screening mammogram.       Current Medication: Outpatient Encounter Medications as of 10/26/2019  Medication Sig  . amoxicillin-clavulanate (AUGMENTIN) 875-125 MG tablet Take 1 tablet by mouth 2 (two) times daily.  . ARIPiprazole ER (ABILIFY MAINTENA) 400 MG PRSY prefilled syringe Inject 400 mg into the muscle every 28 (twenty-eight) days.  . ciprofloxacin-dexamethasone (CIPRODEX) OTIC suspension Place 4 drops into both ears 2 (two) times daily.  Marland Kitchen diltiazem (TIAZAC) 360 MG 24 hr capsule Take 1 capsule (360 mg total) by mouth daily.  . fluconazole (DIFLUCAN) 150 MG tablet Take 1 tablet (150 mg total) by mouth every three (3) days as needed.  . labetalol (NORMODYNE) 200 MG tablet Take 1 tablet (200 mg total) by mouth 2 (two) times daily.  Marland Kitchen linaclotide (LINZESS) 145 MCG CAPS capsule Take 1 capsule (145 mcg total) by mouth daily before breakfast.  . meloxicam (MOBIC) 7.5 MG tablet Take 1 tablet (7.5 mg total) by mouth daily.  . QUEtiapine (SEROQUEL) 100 MG tablet Take 100 mg by mouth at bedtime.  . traMADol (ULTRAM) 50 MG tablet TAKE 2 TABLETS BY MOUTH EVERY 8 HOURS ASNEEDED.  Marland Kitchen traZODone (DESYREL) 100 MG tablet Take 100 mg by mouth at bedtime. 3 tablets at night  . [DISCONTINUED] meloxicam (MOBIC) 7.5 MG tablet TAKE 1 TABLET BY MOUTH DAILY  . [DISCONTINUED] metroNIDAZOLE (FLAGYL)  500 MG tablet Take 1 tablet (500 mg total) by mouth 2 (two) times daily.   No facility-administered encounter medications on file as of 10/26/2019.    Surgical History: Past Surgical History:  Procedure Laterality Date  . ECTOPIC PREGNANCY SURGERY    . ROTATOR CUFF REPAIR      Medical History: Past Medical History:  Diagnosis Date  . Allergy    Environmental  . Anemia   . Anxiety   . Coronary artery disease   . Depression   . GERD (gastroesophageal reflux disease)   . History of borderline personality disorder   . Hypercholesteremia   . Hypertension     Family History: Family History  Family history unknown: Yes    Social History   Socioeconomic History  . Marital status: Single    Spouse name: Not on file  . Number of children: 1  . Years of education: Not on file  . Highest education level: 10th grade  Occupational History  . Not on file  Tobacco Use  . Smoking status: Former Smoker    Types: Cigarettes  . Smokeless tobacco: Never Used  . Tobacco comment: once in a while   Vaping Use  . Vaping Use: Never used  Substance and Sexual Activity  . Alcohol use: No  . Drug use: Not Currently  . Sexual activity: Never  Other Topics Concern  . Not on file  Social History Narrative  . Not on file   Social  Determinants of Health   Financial Resource Strain: Low Risk   . Difficulty of Paying Living Expenses: Not very hard  Food Insecurity: Food Insecurity Present  . Worried About Programme researcher, broadcasting/film/video in the Last Year: Sometimes true  . Ran Out of Food in the Last Year: Sometimes true  Transportation Needs: Unmet Transportation Needs  . Lack of Transportation (Medical): Yes  . Lack of Transportation (Non-Medical): Yes  Physical Activity: Inactive  . Days of Exercise per Week: 0 days  . Minutes of Exercise per Session: 0 min  Stress: Stress Concern Present  . Feeling of Stress : Very much  Social Connections: Unknown  . Frequency of Communication with  Friends and Family: Not on file  . Frequency of Social Gatherings with Friends and Family: Not on file  . Attends Religious Services: Never  . Active Member of Clubs or Organizations: No  . Attends Banker Meetings: Never  . Marital Status: Never married  Intimate Partner Violence: Not At Risk  . Fear of Current or Ex-Partner: No  . Emotionally Abused: No  . Physically Abused: No  . Sexually Abused: No      Review of Systems  Constitutional: Negative for activity change, chills, fatigue and unexpected weight change.  HENT: Negative for congestion, postnasal drip, rhinorrhea, sneezing and sore throat.   Respiratory: Negative for cough, chest tightness, shortness of breath and wheezing.   Cardiovascular: Negative for chest pain and palpitations.  Gastrointestinal: Negative for abdominal pain, constipation, diarrhea, nausea and vomiting.  Endocrine: Negative for cold intolerance, heat intolerance, polydipsia and polyuria.  Genitourinary: Positive for urgency. Negative for dysuria and frequency.       The patient continues to suffer from urinary incontinence. She does require supplies for her incontinence.   Musculoskeletal: Negative for arthralgias, back pain, joint swelling and neck pain.  Skin: Negative for rash.  Allergic/Immunologic: Negative for environmental allergies.  Neurological: Negative for dizziness, tremors, numbness and headaches.  Hematological: Negative for adenopathy. Does not bruise/bleed easily.  Psychiatric/Behavioral: Positive for dysphoric mood. Negative for behavioral problems (Depression), sleep disturbance and suicidal ideas. The patient is nervous/anxious.        The patient sees psychiatry on regular basis.     Today's Vitals   10/26/19 1401  BP: 113/66  Pulse: 67  Resp: 16  Temp: (!) 96.5 F (35.8 C)  SpO2: 96%  Weight: 258 lb 3.2 oz (117.1 kg)  Height: 5\' 4"  (1.626 m)   Body mass index is 44.32 kg/m.  Physical Exam Vitals and  nursing note reviewed.  Constitutional:      General: She is not in acute distress.    Appearance: Normal appearance. She is well-developed. She is obese. She is not diaphoretic.  HENT:     Head: Normocephalic and atraumatic.     Nose: Nose normal.     Mouth/Throat:     Pharynx: No oropharyngeal exudate.  Eyes:     Pupils: Pupils are equal, round, and reactive to light.  Neck:     Thyroid: No thyromegaly.     Vascular: No JVD.     Trachea: No tracheal deviation.  Cardiovascular:     Rate and Rhythm: Normal rate and regular rhythm.     Heart sounds: Normal heart sounds. No murmur heard.  No friction rub. No gallop.   Pulmonary:     Effort: Pulmonary effort is normal. No respiratory distress.     Breath sounds: Normal breath sounds. No wheezing or rales.  Chest:     Chest wall: No tenderness.  Abdominal:     Palpations: Abdomen is soft.  Musculoskeletal:        General: Normal range of motion.     Cervical back: Normal range of motion and neck supple.  Lymphadenopathy:     Cervical: No cervical adenopathy.  Skin:    General: Skin is warm and dry.  Neurological:     General: No focal deficit present.     Mental Status: She is alert and oriented to person, place, and time. Mental status is at baseline.     Cranial Nerves: No cranial nerve deficit.  Psychiatric:        Mood and Affect: Mood normal.        Behavior: Behavior normal.        Thought Content: Thought content normal.        Judgment: Judgment normal.    Assessment/Plan: 1. Essential (primary) hypertension Stable. Continue bp medication as prescribed   2. Stress incontinence of urine Continue use of incontinence supplies as needed   3. Depression, recurrent (HCC) Continue regular visits with psychiatry as scheduled.   4. Encounter for screening mammogram for malignant neoplasm of breast - MM DIGITAL SCREENING BILATERAL; Future  General Counseling: reannah totten understanding of the findings of todays  visit and agrees with plan of treatment. I have discussed any further diagnostic evaluation that may be needed or ordered today. We also reviewed her medications today. she has been encouraged to call the office with any questions or concerns that should arise related to todays visit.  This patient was seen by Vincent Gros FNP Collaboration with Dr Lyndon Code as a part of collaborative care agreement  Orders Placed This Encounter  Procedures  . MM DIGITAL SCREENING BILATERAL      Total time spent: 25 Minutes   Time spent includes review of chart, medications, test results, and follow up plan with the patient.      Dr Lyndon Code Internal medicine

## 2019-11-01 DIAGNOSIS — Z1231 Encounter for screening mammogram for malignant neoplasm of breast: Secondary | ICD-10-CM | POA: Insufficient documentation

## 2019-11-01 DIAGNOSIS — F339 Major depressive disorder, recurrent, unspecified: Secondary | ICD-10-CM | POA: Insufficient documentation

## 2019-11-02 ENCOUNTER — Telehealth: Payer: Self-pay

## 2019-11-02 NOTE — Telephone Encounter (Signed)
Incontinence order supply form signed by provider and faxed back to [facility]. Signed copy placed in scan.

## 2020-01-02 ENCOUNTER — Telehealth: Payer: Self-pay

## 2020-01-02 ENCOUNTER — Other Ambulatory Visit: Payer: Self-pay | Admitting: Nurse Practitioner

## 2020-01-02 DIAGNOSIS — G8929 Other chronic pain: Secondary | ICD-10-CM

## 2020-01-02 DIAGNOSIS — M25511 Pain in right shoulder: Secondary | ICD-10-CM

## 2020-01-02 MED ORDER — TRAMADOL HCL 50 MG PO TABS: ORAL_TABLET | ORAL | 0 refills | Status: DC

## 2020-01-02 NOTE — Telephone Encounter (Signed)
Sent single prescription for #30 tablets tramadol to her pharmacy.

## 2020-01-03 ENCOUNTER — Telehealth: Payer: Self-pay

## 2020-01-03 NOTE — Telephone Encounter (Signed)
As per taylor its ok to fill tramadol due to interact with med just make sure pt Cannot med on same time a

## 2020-01-03 NOTE — Telephone Encounter (Signed)
Try to pt we send med need to  been seen for further refills

## 2020-01-04 ENCOUNTER — Telehealth: Payer: Self-pay

## 2020-01-04 NOTE — Telephone Encounter (Signed)
Confirmed and screened for 01-08-20 ov. 

## 2020-01-08 ENCOUNTER — Ambulatory Visit: Payer: Medicaid Other | Admitting: Internal Medicine

## 2020-01-09 ENCOUNTER — Telehealth: Payer: Self-pay

## 2020-01-09 ENCOUNTER — Ambulatory Visit: Payer: Medicaid Other | Admitting: Hospice and Palliative Medicine

## 2020-01-09 NOTE — Telephone Encounter (Signed)
Confirmed and screened for 01-11-20 ov. 

## 2020-01-11 ENCOUNTER — Encounter: Payer: Self-pay | Admitting: Internal Medicine

## 2020-01-11 ENCOUNTER — Ambulatory Visit (INDEPENDENT_AMBULATORY_CARE_PROVIDER_SITE_OTHER): Payer: Medicaid Other | Admitting: Hospice and Palliative Medicine

## 2020-01-11 ENCOUNTER — Telehealth: Payer: Self-pay

## 2020-01-11 ENCOUNTER — Encounter: Payer: Self-pay | Admitting: Hospice and Palliative Medicine

## 2020-01-11 ENCOUNTER — Other Ambulatory Visit: Payer: Self-pay

## 2020-01-11 DIAGNOSIS — R825 Elevated urine levels of drugs, medicaments and biological substances: Secondary | ICD-10-CM

## 2020-01-11 DIAGNOSIS — F1920 Other psychoactive substance dependence, uncomplicated: Secondary | ICD-10-CM | POA: Diagnosis not present

## 2020-01-11 NOTE — Progress Notes (Signed)
[facility] [address] [place], [place] [address]  Internal MEDICINE  Office Visit Note  Patient Name: Christina Richards  [id number]  [id number]  Date of Service: 01/11/2020  Chief Complaint  Patient presents with  . Acute Visit    shoulders hurt, right worse than left and worse at night, pt had surgery on right shoulder 05/09/17, right knee hurts  . Depression  . Hyperlipidemia  . Hypertension  . Quality Metric Gaps    TDAP, hepC,      HPI Pt is here for a sick visit. She is here complaining of left shoulder pain, says she has had shoulder surgery in the past and continues to have pain Also complaining of left knee pain, says she has arthritis Was given Tramadol 01/02/2020 30 tablets, when asked about why she is requesting more she says it is because it is not working and would like her dose increased.    Current Medication:  Outpatient Encounter Medications as of 01/11/2020  Medication Sig  . ARIPiprazole ER (ABILIFY MAINTENA) 400 MG PRSY prefilled syringe Inject 400 mg into the muscle every 28 (twenty-eight) days.  . ciprofloxacin-dexamethasone (CIPRODEX) OTIC suspension Place 4 drops into both ears 2 (two) times daily.  Marland Kitchen diltiazem (TIAZAC) 360 MG 24 hr capsule Take 1 capsule (360 mg total) by mouth daily.  Marland Kitchen linaclotide (LINZESS) 145 MCG CAPS capsule Take 1 capsule (145 mcg total) by mouth daily before breakfast.  . meloxicam (MOBIC) 7.5 MG tablet Take 1 tablet (7.5 mg total) by mouth daily.  . QUEtiapine (SEROQUEL) 100 MG tablet Take 100 mg by mouth at bedtime.  . traMADol (ULTRAM) 50 MG tablet TAKE 2 TABLETS BY MOUTH EVERY 8 HOURS ASNEEDED.  Marland Kitchen traZODone (DESYREL) 100 MG tablet Take 100 mg by mouth at bedtime. 3 tablets at night  . amoxicillin-clavulanate (AUGMENTIN) 875-125 MG tablet Take 1 tablet by mouth 2 (two) times daily. (Patient not taking: Reported on 01/11/2020)  . fluconazole (DIFLUCAN) 150 MG tablet Take 1 tablet (150 mg total) by mouth every three  (3) days as needed. (Patient not taking: Reported on 01/11/2020)  . labetalol (NORMODYNE) 200 MG tablet Take 1 tablet (200 mg total) by mouth 2 (two) times daily. (Patient not taking: Reported on 01/11/2020)   No facility-administered encounter medications on file as of 01/11/2020.      Medical History: Past Medical History:  Diagnosis Date  . Allergy    Environmental  . Anemia   . Anxiety   . Coronary artery disease   . Depression   . GERD (gastroesophageal reflux disease)   . History of borderline personality disorder   . History of rotator cuff surgery 05/09/2017   right shoulder  . Hypercholesteremia   . Hypertension      Vital Signs: Temp 98.4 F (36.9 C)   Resp 16   Ht 5\' 4"  (1.626 m)   Wt 259 lb 12.8 oz (117.8 kg)   LMP 12/08/2012 Comment: no periods since" I was 39"  BMI 44.59 kg/m    Review of Systems  Constitutional: Negative for chills, diaphoresis and fatigue.  HENT: Negative for ear pain, postnasal drip and sinus pressure.   Eyes: Negative for photophobia, discharge, redness, itching and visual disturbance.  Respiratory: Negative for cough, shortness of breath and wheezing.   Cardiovascular: Negative for chest pain, palpitations and leg swelling.  Gastrointestinal: Negative for abdominal pain, constipation, diarrhea, nausea and vomiting.  Genitourinary: Negative for dysuria and flank pain.  Musculoskeletal: Positive for arthralgias, back  pain and neck pain. Negative for gait problem.  Skin: Negative for color change.  Allergic/Immunologic: Negative for environmental allergies and food allergies.  Neurological: Negative for dizziness and headaches.  Hematological: Does not bruise/bleed easily.  Psychiatric/Behavioral: Negative for agitation, behavioral problems (depression) and hallucinations.    Physical Exam No exam was performed    Assessment/Plan: 1. Positive urine drug screen Examination of her urine drug screen showed that she had placed water  in her specimen cup. There was no evidence of urine in specimen, clear liquid with no sediment or color, no odor. Approached patient about his and she became very upset, consequently she was asked to leave the office and will discharged as a patient from our office.  General Counseling: Christina Richards understanding of the findings of todays visit and agrees with plan of treatment. I have discussed any further diagnostic evaluation that may be needed or ordered today. We also reviewed her medications today. she has been encouraged to call the office with any questions or concerns that should arise related to todays visit.  Time spent: 30 Minutes  This patient was seen by Christina Richards AGNP-C in Collaboration with Dr Christina Richards as a part of collaborative care agreement.  Christina Richards [facility] Internal Medicine

## 2020-01-11 NOTE — Telephone Encounter (Signed)
MAILED PATIENT DISCHARGE LETTER, D.C. FROM [facility] 01/11/20

## 2020-01-11 NOTE — Progress Notes (Signed)
MAILED AND SCANNED IN D.C. LETTER FROM [facility]

## 2020-01-12 ENCOUNTER — Encounter: Payer: Self-pay | Admitting: Hospice and Palliative Medicine

## 2020-02-26 ENCOUNTER — Ambulatory Visit: Payer: Medicaid Other | Admitting: Nurse Practitioner

## 2020-04-04 LAB — COLOGUARD

## 2020-07-04 ENCOUNTER — Other Ambulatory Visit: Payer: Self-pay | Admitting: Sports Medicine

## 2020-07-04 DIAGNOSIS — G8929 Other chronic pain: Secondary | ICD-10-CM

## 2020-07-04 DIAGNOSIS — M7551 Bursitis of right shoulder: Secondary | ICD-10-CM

## 2020-07-04 DIAGNOSIS — M2342 Loose body in knee, left knee: Secondary | ICD-10-CM

## 2020-07-04 DIAGNOSIS — M7541 Impingement syndrome of right shoulder: Secondary | ICD-10-CM

## 2020-07-04 DIAGNOSIS — M25562 Pain in left knee: Secondary | ICD-10-CM

## 2020-07-04 DIAGNOSIS — M778 Other enthesopathies, not elsewhere classified: Secondary | ICD-10-CM

## 2020-07-19 ENCOUNTER — Ambulatory Visit: Payer: Medicaid Other

## 2020-07-22 ENCOUNTER — Other Ambulatory Visit: Payer: Medicaid Other

## 2020-07-22 ENCOUNTER — Other Ambulatory Visit: Payer: Self-pay

## 2020-07-22 ENCOUNTER — Ambulatory Visit
Admission: RE | Admit: 2020-07-22 | Discharge: 2020-07-22 | Disposition: A | Discharge status: Home / Self Care | Payer: Medicaid Other | Source: Ambulatory Visit | Attending: Sports Medicine | Admitting: Sports Medicine

## 2020-07-22 DIAGNOSIS — M25562 Pain in left knee: Secondary | ICD-10-CM | POA: Insufficient documentation

## 2020-07-22 DIAGNOSIS — M2342 Loose body in knee, left knee: Secondary | ICD-10-CM | POA: Insufficient documentation

## 2020-07-22 DIAGNOSIS — M778 Other enthesopathies, not elsewhere classified: Secondary | ICD-10-CM | POA: Diagnosis present

## 2020-07-22 DIAGNOSIS — M25511 Pain in right shoulder: Secondary | ICD-10-CM | POA: Insufficient documentation

## 2020-07-22 DIAGNOSIS — M7541 Impingement syndrome of right shoulder: Secondary | ICD-10-CM | POA: Insufficient documentation

## 2020-07-22 DIAGNOSIS — G8929 Other chronic pain: Secondary | ICD-10-CM

## 2020-07-22 DIAGNOSIS — M7551 Bursitis of right shoulder: Secondary | ICD-10-CM | POA: Diagnosis present

## 2020-07-30 ENCOUNTER — Encounter: Payer: Self-pay | Admitting: *Deleted

## 2020-08-19 ENCOUNTER — Ambulatory Visit: Payer: Self-pay | Admitting: Urology

## 2020-09-19 NOTE — Progress Notes (Signed)
Chart reviewed with Dr. Hyacinth Meeker. Patient will need day of surgery urine drug screening.

## 2020-09-20 ENCOUNTER — Encounter (HOSPITAL_BASED_OUTPATIENT_CLINIC_OR_DEPARTMENT_OTHER): Payer: Self-pay | Admitting: Orthopaedic Surgery

## 2020-09-20 ENCOUNTER — Other Ambulatory Visit: Payer: Self-pay

## 2020-09-23 ENCOUNTER — Ambulatory Visit: Payer: Self-pay | Admitting: Urology

## 2020-09-23 NOTE — Discharge Instructions (Addendum)
Ramond Marrow MD, MPH Alfonse Alpers, PA-C [facility] [address], [address] [phone] (tel)   [phone] (fax)   POST-OPERATIVE INSTRUCTIONS - SHOULDER ARTHROSCOPY  WOUND CARE You may remove the Operative Dressing on Post-Op Day #3 (72hrs after surgery).   Alternatively if you would like you can leave dressing on until follow-up if within 7-8 days but keep it dry. Leave steri-strips in place until they fall off on their own, usually 2 weeks postop. There may be a small amount of fluid/bleeding leaking at the surgical site.  This is normal; the shoulder is filled with fluid during the procedure and can leak for 24-48hrs after surgery.  You may change/reinforce the bandage as needed.  Use the Cryocuff or Ice as often as possible for the first 7 days, then as needed for pain relief. Always keep a towel, ACE wrap or other barrier between the cooling unit and your skin.  You may shower on Post-Op Day #3. Gently pat the area dry. Do not soak the shoulder in water or submerge it. Keep dry incisions as dry as possible. Do not go swimming in the pool or ocean until 4 weeks after surgery or when otherwise instructed.    EXERCISES/BRACING Sling should be used at all times until follow-up.  You can remove sling for hygiene.    Please continue to ambulate and do not stay sitting or lying for too long. Perform foot and wrist pumps to assist in circulation.  POST-OP MEDICATIONS- Multimodal approach to pain control In general your pain will be controlled with a combination of substances.  Prescriptions unless otherwise discussed are electronically sent to your pharmacy.  This is a carefully made plan we use to minimize narcotic use.     Celebrex - Anti-inflammatory medication taken on a scheduled basis Acetaminophen - Non-narcotic pain medicine taken on a scheduled basis  Oxycodone - This is a strong narcotic, to be used only on an "as needed" basis for SEVERE  pain. Robaxin - this is a muscle relaxer, take as needed for muscle spasms Phenergan - take as needed for nausea  FOLLOW-UP If you develop a Fever (?101.5), Redness or Drainage from the surgical incision site, please call our office to arrange for an evaluation. Please call the office to schedule a follow-up appointment for your wound check, 10-14 days post-operatively.    HELPFUL INFORMATION  If you had a block, it will wear off between 8-24 hrs postop typically.  This is period when your pain may go from nearly zero to the pain you would have had postop without the block.  This is an abrupt transition but nothing dangerous is happening.  You may take an extra dose of narcotic when this happens.  You may be more comfortable sleeping in a semi-seated position the first few nights following surgery.  Keep a pillow propped under the elbow and forearm for comfort.  If you have a recliner type of chair it might be beneficial.  If not that is fine too, but it would be helpful to sleep propped up with pillows behind your operated shoulder as well under your elbow and forearm.  This will reduce pulling on the suture lines.  When dressing, put your operative arm in the sleeve first.  When getting undressed, take your operative arm out last.  Loose fitting, button-down shirts are recommended.  Often in the first days after surgery you may be more comfortable keeping your operative arm under your shirt and not through  the sleeve.  You may return to work/school in the next couple of days when you feel up to it.  Desk work and typing in the sling is fine.  We suggest you use the pain medication the first night prior to going to bed, in order to ease any pain when the anesthesia wears off. You should avoid taking pain medications on an empty stomach as it will make you nauseous.  You should wean off your narcotic medicines as soon as you are able.  Most patients will be off or using minimal narcotics before  their first postop appointment.   Do not drink alcoholic beverages or take illicit drugs when taking pain medications.  It is against the law to drive while taking narcotics.  In some states it is against the law to drive while your arm is in a sling.   Pain medication may make you constipated.  Below are a few solutions to try in this order: Decrease the amount of pain medication if you aren't having pain. Drink lots of decaffeinated fluids. Drink prune juice and/or eat dried prunes  If the first 3 don't work start with additional solutions Take Colace - an over-the-counter stool softener Take Senokot - an over-the-counter laxative Take Miralax - a stronger over-the-counter laxative  For more information including helpful videos and documents visit our website:   [email or web address]   No tylenol until after 4pm today.  Post Anesthesia Home Care Instructions  Activity: Get plenty of rest for the remainder of the day. A responsible individual must stay with you for 24 hours following the procedure.  For the next 24 hours, DO NOT: -Drive a car -Advertising copywriter -Drink alcoholic beverages -Take any medication unless instructed by your physician -Make any legal decisions or sign important papers.  Meals: Start with liquid foods such as gelatin or soup. Progress to regular foods as tolerated. Avoid greasy, spicy, heavy foods. If nausea and/or vomiting occur, drink only clear liquids until the nausea and/or vomiting subsides. Call your physician if vomiting continues.  Special Instructions/Symptoms: Your throat may feel dry or sore from the anesthesia or the breathing tube placed in your throat during surgery. If this causes discomfort, gargle with warm salt water. The discomfort should disappear within 24 hours.  If you had a scopolamine patch placed behind your ear for the management of post- operative nausea and/or vomiting:  1. The medication  in the patch is effective for 72 hours, after which it should be removed.  Wrap patch in a tissue and discard in the trash. Wash hands thoroughly with soap and water. 2. You may remove the patch earlier than 72 hours if you experience unpleasant side effects which may include dry mouth, dizziness or visual disturbances. 3. Avoid touching the patch. Wash your hands with soap and water after contact with the patch.    Regional Anesthesia Blocks  1. Numbness or the inability to move the "blocked" extremity may last from 3-48 hours after placement. The length of time depends on the medication injected and your individual response to the medication. If the numbness is not going away after 48 hours, call your surgeon.  2. The extremity that is blocked will need to be protected until the numbness is gone and the  Strength has returned. Because you cannot feel it, you will need to take extra care to avoid injury. Because it may be weak, you may have difficulty moving it or using it. You may not know what position it is  in without looking at it while the block is in effect.  3. For blocks in the legs and feet, returning to weight bearing and walking needs to be done carefully. You will need to wait until the numbness is entirely gone and the strength has returned. You should be able to move your leg and foot normally before you try and bear weight or walk. You will need someone to be with you when you first try to ensure you do not fall and possibly risk injury.  4. Bruising and tenderness at the needle site are common side effects and will resolve in a few days.  5. Persistent numbness or new problems with movement should be communicated to the surgeon or the [facility] [phone] [facility] [phone]). Information for Discharge Teaching: EXPAREL (bupivacaine liposome injectable suspension)   Your surgeon or anesthesiologist gave you EXPAREL(bupivacaine) to help control  your pain after surgery.  EXPAREL is a local anesthetic that provides pain relief by numbing the tissue around the surgical site. EXPAREL is designed to release pain medication over time and can control pain for up to 72 hours. Depending on how you respond to EXPAREL, you may require less pain medication during your recovery.  Possible side effects: Temporary loss of sensation or ability to move in the area where bupivacaine was injected. Nausea, vomiting, constipation Rarely, numbness and tingling in your mouth or lips, lightheadedness, or anxiety may occur. Call your doctor right away if you think you may be experiencing any of these sensations, or if you have other questions regarding possible side effects.  Follow all other discharge instructions given to you by your surgeon or nurse. Eat a healthy diet and drink plenty of water or other fluids.  If you return to the hospital for any reason within 96 hours following the administration of EXPAREL, it is important for health care providers to know that you have received this anesthetic. A teal colored band has been placed on your arm with the date, time and amount of EXPAREL you have received in order to alert and inform your health care providers. Please leave this armband in place for the full 96 hours following administration, and then you may remove the band.

## 2020-09-23 NOTE — H&P (Signed)
PREOPERATIVE H&P  Chief Complaint: RIGHT SHOULDER PRIMARY OSTEOARTHRITIS IMPINGEMENT AND ROTATOR CUFF STRAIN  HPI: Christina Richards is a 60 y.o. female who is scheduled for, Procedure(s): SHOULDER ARTHROSCOPY DEBRIDEMENT WITH SUBACROMIAL DECOMPRESSION AND DISTAL CLAVICLE EXCISION AND ROTATOR CUFF REPAIR.   Patient has a past medical history significant for GERD, HTN, HLD, CAD, substance abuse, bipolar disorder and schizophrenia.   This is a 60 year old who had right shoulder cuff repair in 2016.  She was pain free for about three years.  She re-injured her shoulder.  She is having difficulty lifting.  No numbness or tingling. The patient reports continued pain and dysfunction of the right shoulder. She is unable to really use the shoulder for much.    Her symptoms are rated as moderate to severe, and have been worsening.  This is significantly impairing activities of daily living.    Please see clinic note for further details on this patient's care.    She has elected for surgical management.   Past Medical History:  Diagnosis Date  . Allergy    Environmental  . Anemia   . Anxiety   . Bipolar disorder (HCC)   . Cocaine abuse (HCC)    smokes crack  . Coronary artery disease   . Depression   . GERD (gastroesophageal reflux disease)   . History of borderline personality disorder   . History of rotator cuff surgery 05/09/2017   right shoulder  . Hypercholesteremia   . Hypertension   . Sleep apnea    does not use CPAP   Past Surgical History:  Procedure Laterality Date  . ECTOPIC PREGNANCY SURGERY    . HAND SURGERY Right    thumb fusion  . ROTATOR CUFF REPAIR     Social History   Socioeconomic History  . Marital status: Single    Spouse name: Not on file  . Number of children: 1  . Years of education: Not on file  . Highest education level: 10th grade  Occupational History  . Not on file  Tobacco Use  . Smoking status: Current Every Day Smoker    Packs/day: 0.25     Types: Cigarettes  . Smokeless tobacco: Never Used  Vaping Use  . Vaping Use: Never used  Substance and Sexual Activity  . Alcohol use: No  . Drug use: Not Currently    Types: "Crack" cocaine    Comment: last smoked 09-17-20  . Sexual activity: Never    Birth control/protection: Post-menopausal  Other Topics Concern  . Not on file  Social History Narrative  . Not on file   Social Determinants of Health   Financial Resource Strain: Not on file  Food Insecurity: Not on file  Transportation Needs: Not on file  Physical Activity: Not on file  Stress: Not on file  Social Connections: Not on file   Family History  Family history unknown: Yes   Allergies  Allergen Reactions  . Ibuprofen Nausea Only   Prior to Admission medications   Medication Sig Start Date End Date Taking? Authorizing Provider  acetaminophen (TYLENOL) 500 MG tablet Take 500 mg by mouth every 6 (six) hours as needed.   Yes [provider]  diclofenac Sodium (VOLTAREN) 1 % GEL Apply topically 4 (four) times daily.   Yes [provider]  diltiazem (TIAZAC) 360 MG 24 hr capsule Take 1 capsule (360 mg total) by mouth daily. 05/26/19  Yes Scarboro, Coralee North, NP  loratadine (CLARITIN) 10 MG tablet Take 10 mg  by mouth daily.   Yes [provider]    ROS: All other systems have been reviewed and were otherwise negative with the exception of those mentioned in the HPI and as above.  Physical Exam: General: Alert, no acute distress Cardiovascular: No pedal edema Respiratory: No cyanosis, no use of accessory musculature GI: No organomegaly, abdomen is soft and non-tender Skin: No lesions in the area of chief complaint Neurologic: Sensation intact distally Psychiatric: Patient is competent for consent with normal mood and affect Lymphatic: No axillary or cervical lymphadenopathy  MUSCULOSKELETAL:  Right shoulder active forward elevation to about 30.  External rotation to 30.  Further  testing is extraordinarily limited secondary to patient pain and cooperation.    Imaging: MRI reviewed in [place], which demonstrates a partial thickness high grade re-tear of her cuff.  There is an area that may be full thickness.  She has some fluid around her [facility] joint as well.   Assessment: RIGHT SHOULDER PRIMARY OSTEOARTHRITIS IMPINGEMENT AND ROTATOR CUFF STRAIN  Plan: Plan for Procedure(s): SHOULDER ARTHROSCOPY DEBRIDEMENT WITH SUBACROMIAL DECOMPRESSION AND DISTAL CLAVICLE EXCISION AND ROTATOR CUFF REPAIR  The risks benefits and alternatives were discussed with the patient including but not limited to the risks of nonoperative treatment, versus surgical intervention including infection, bleeding, nerve injury,  blood clots, cardiopulmonary complications, morbidity, mortality, among others, and they were willing to proceed.   The patient acknowledged the explanation, agreed to proceed with the plan and consent was signed.   Operative Plan: Right shoulder scope with cuff debridement, distal clavicle excision, revision subacromial decompression and assessment under anesthesia Discharge Medications: Tylenol, Celebrex, Oxycodone, Zofran, RObaxin DVT Prophylaxis: n/a Physical Therapy: outpatient PT Special Discharge needs: Sling. IceMan.    Vernetta Honey, PA-C  09/23/2020 5:31 PM

## 2020-09-26 ENCOUNTER — Ambulatory Visit (HOSPITAL_BASED_OUTPATIENT_CLINIC_OR_DEPARTMENT_OTHER): Payer: Medicaid Other | Admitting: Anesthesiology

## 2020-09-26 ENCOUNTER — Ambulatory Visit (HOSPITAL_BASED_OUTPATIENT_CLINIC_OR_DEPARTMENT_OTHER)
Admission: RE | Admit: 2020-09-26 | Discharge: 2020-09-26 | Disposition: A | Discharge status: Home / Self Care | Payer: Medicaid Other | Attending: Orthopaedic Surgery | Admitting: Orthopaedic Surgery

## 2020-09-26 ENCOUNTER — Other Ambulatory Visit: Payer: Self-pay

## 2020-09-26 ENCOUNTER — Encounter (HOSPITAL_BASED_OUTPATIENT_CLINIC_OR_DEPARTMENT_OTHER): Payer: Self-pay | Admitting: Orthopaedic Surgery

## 2020-09-26 ENCOUNTER — Encounter (HOSPITAL_BASED_OUTPATIENT_CLINIC_OR_DEPARTMENT_OTHER)
Admission: RE | Disposition: A | Discharge status: Home / Self Care | Payer: Self-pay | Source: Home / Self Care | Attending: Orthopaedic Surgery

## 2020-09-26 DIAGNOSIS — Z8759 Personal history of other complications of pregnancy, childbirth and the puerperium: Secondary | ICD-10-CM | POA: Insufficient documentation

## 2020-09-26 DIAGNOSIS — Z886 Allergy status to analgesic agent status: Secondary | ICD-10-CM | POA: Insufficient documentation

## 2020-09-26 DIAGNOSIS — M19011 Primary osteoarthritis, right shoulder: Secondary | ICD-10-CM | POA: Diagnosis not present

## 2020-09-26 DIAGNOSIS — G4730 Sleep apnea, unspecified: Secondary | ICD-10-CM | POA: Diagnosis not present

## 2020-09-26 DIAGNOSIS — F17210 Nicotine dependence, cigarettes, uncomplicated: Secondary | ICD-10-CM | POA: Diagnosis not present

## 2020-09-26 DIAGNOSIS — I10 Essential (primary) hypertension: Secondary | ICD-10-CM | POA: Diagnosis not present

## 2020-09-26 DIAGNOSIS — E7800 Pure hypercholesterolemia, unspecified: Secondary | ICD-10-CM | POA: Insufficient documentation

## 2020-09-26 DIAGNOSIS — I2510 Atherosclerotic heart disease of native coronary artery without angina pectoris: Secondary | ICD-10-CM | POA: Diagnosis not present

## 2020-09-26 DIAGNOSIS — N959 Unspecified menopausal and perimenopausal disorder: Secondary | ICD-10-CM | POA: Insufficient documentation

## 2020-09-26 DIAGNOSIS — M75101 Unspecified rotator cuff tear or rupture of right shoulder, not specified as traumatic: Secondary | ICD-10-CM | POA: Diagnosis present

## 2020-09-26 DIAGNOSIS — E785 Hyperlipidemia, unspecified: Secondary | ICD-10-CM | POA: Insufficient documentation

## 2020-09-26 DIAGNOSIS — Z79899 Other long term (current) drug therapy: Secondary | ICD-10-CM | POA: Insufficient documentation

## 2020-09-26 HISTORY — PX: RESECTION DISTAL CLAVICAL: SHX5053

## 2020-09-26 HISTORY — DX: Bipolar disorder, unspecified: F31.9

## 2020-09-26 HISTORY — DX: Sleep apnea, unspecified: G47.30

## 2020-09-26 HISTORY — DX: Cocaine abuse, uncomplicated: F14.10

## 2020-09-26 HISTORY — PX: SHOULDER ARTHROSCOPY WITH ROTATOR CUFF REPAIR AND SUBACROMIAL DECOMPRESSION: SHX5686

## 2020-09-26 LAB — RAPID URINE DRUG SCREEN, HOSP PERFORMED
Amphetamines: NOT DETECTED
Barbiturates: NOT DETECTED
Benzodiazepines: NOT DETECTED
Cocaine: POSITIVE — AB
Opiates: NOT DETECTED
Tetrahydrocannabinol: NOT DETECTED

## 2020-09-26 SURGERY — SHOULDER ARTHROSCOPY WITH ROTATOR CUFF REPAIR AND SUBACROMIAL DECOMPRESSION
Anesthesia: General | Site: Shoulder | Laterality: Right

## 2020-09-26 MED ORDER — ACETAMINOPHEN ER 650 MG PO TBCR: 650.0000 mg | EXTENDED_RELEASE_TABLET | Freq: Three times a day (TID) | ORAL | 0 refills | Status: AC

## 2020-09-26 MED ORDER — SODIUM CHLORIDE 0.9 % IV SOLN
INTRAVENOUS | Status: DC | PRN
  Administered 2020-09-26 (×2): 50 ug/min via INTRAVENOUS

## 2020-09-26 MED ORDER — OXYCODONE HCL 5 MG/5ML PO SOLN: 5.0000 mg | Freq: Once | ORAL | Status: AC | PRN

## 2020-09-26 MED ORDER — LIDOCAINE HCL (CARDIAC) PF 100 MG/5ML IV SOSY
PREFILLED_SYRINGE | INTRAVENOUS | Status: DC | PRN
  Administered 2020-09-26: 100 mg via INTRAVENOUS

## 2020-09-26 MED ORDER — BUPIVACAINE-EPINEPHRINE (PF) 0.5% -1:200000 IJ SOLN
INTRAMUSCULAR | Status: DC | PRN
  Administered 2020-09-26: 15 mL via PERINEURAL

## 2020-09-26 MED ORDER — LACTATED RINGERS IV SOLN: INTRAVENOUS | Status: DC

## 2020-09-26 MED ORDER — ONDANSETRON HCL 4 MG/2ML IJ SOLN
INTRAMUSCULAR | Status: DC | PRN
  Administered 2020-09-26: 4 mg via INTRAVENOUS

## 2020-09-26 MED ORDER — OXYCODONE HCL 5 MG PO TABS
5.0000 mg | ORAL_TABLET | Freq: Once | ORAL | Status: AC | PRN
  Administered 2020-09-26: 5 mg via ORAL

## 2020-09-26 MED ORDER — OXYCODONE HCL 5 MG PO TABS
ORAL_TABLET | ORAL | Status: AC
  Filled 2020-09-26: qty 1

## 2020-09-26 MED ORDER — GABAPENTIN 300 MG PO CAPS
ORAL_CAPSULE | ORAL | Status: AC
  Filled 2020-09-26: qty 1

## 2020-09-26 MED ORDER — FENTANYL CITRATE (PF) 100 MCG/2ML IJ SOLN
INTRAMUSCULAR | Status: AC
  Filled 2020-09-26: qty 2

## 2020-09-26 MED ORDER — PROMETHAZINE HCL 25 MG PO TABS: 25.0000 mg | ORAL_TABLET | Freq: Four times a day (QID) | ORAL | 0 refills | Status: DC | PRN

## 2020-09-26 MED ORDER — CEFAZOLIN SODIUM-DEXTROSE 2-4 GM/100ML-% IV SOLN
2.0000 g | INTRAVENOUS | Status: AC
  Administered 2020-09-26: 3 g via INTRAVENOUS

## 2020-09-26 MED ORDER — FENTANYL CITRATE (PF) 100 MCG/2ML IJ SOLN
100.0000 ug | Freq: Once | INTRAMUSCULAR | Status: AC
  Administered 2020-09-26: 100 ug via INTRAVENOUS

## 2020-09-26 MED ORDER — CEFAZOLIN SODIUM-DEXTROSE 2-4 GM/100ML-% IV SOLN
INTRAVENOUS | Status: AC
  Filled 2020-09-26: qty 100

## 2020-09-26 MED ORDER — FENTANYL CITRATE (PF) 100 MCG/2ML IJ SOLN: 25.0000 ug | INTRAMUSCULAR | Status: DC | PRN

## 2020-09-26 MED ORDER — ROCURONIUM BROMIDE 100 MG/10ML IV SOLN
INTRAVENOUS | Status: DC | PRN
  Administered 2020-09-26: 60 mg via INTRAVENOUS

## 2020-09-26 MED ORDER — PROPOFOL 10 MG/ML IV BOLUS
INTRAVENOUS | Status: DC | PRN
  Administered 2020-09-26: 200 mg via INTRAVENOUS

## 2020-09-26 MED ORDER — METHOCARBAMOL 500 MG PO TABS: 500.0000 mg | ORAL_TABLET | Freq: Three times a day (TID) | ORAL | 0 refills | Status: DC | PRN

## 2020-09-26 MED ORDER — OXYCODONE HCL 5 MG PO TABS: ORAL_TABLET | ORAL | 0 refills | Status: AC

## 2020-09-26 MED ORDER — CEFAZOLIN SODIUM-DEXTROSE 1-4 GM/50ML-% IV SOLN
INTRAVENOUS | Status: AC
  Filled 2020-09-26: qty 50

## 2020-09-26 MED ORDER — MIDAZOLAM HCL 2 MG/2ML IJ SOLN
INTRAMUSCULAR | Status: AC
  Filled 2020-09-26: qty 2

## 2020-09-26 MED ORDER — PROMETHAZINE HCL 25 MG/ML IJ SOLN: 6.2500 mg | INTRAMUSCULAR | Status: DC | PRN

## 2020-09-26 MED ORDER — DEXAMETHASONE SODIUM PHOSPHATE 10 MG/ML IJ SOLN
INTRAMUSCULAR | Status: AC
  Filled 2020-09-26: qty 1

## 2020-09-26 MED ORDER — ACETAMINOPHEN 500 MG PO TABS
1000.0000 mg | ORAL_TABLET | Freq: Once | ORAL | Status: AC
  Administered 2020-09-26: 1000 mg via ORAL

## 2020-09-26 MED ORDER — SODIUM CHLORIDE 0.9 % IR SOLN
Status: DC | PRN
  Administered 2020-09-26: 2000 mL

## 2020-09-26 MED ORDER — SUGAMMADEX SODIUM 200 MG/2ML IV SOLN
INTRAVENOUS | Status: DC | PRN
  Administered 2020-09-26: 200 mg via INTRAVENOUS

## 2020-09-26 MED ORDER — DEXAMETHASONE SODIUM PHOSPHATE 4 MG/ML IJ SOLN
INTRAMUSCULAR | Status: DC | PRN
  Administered 2020-09-26: 5 mg via INTRAVENOUS

## 2020-09-26 MED ORDER — ONDANSETRON HCL 4 MG/2ML IJ SOLN
INTRAMUSCULAR | Status: AC
  Filled 2020-09-26: qty 2

## 2020-09-26 MED ORDER — ACETAMINOPHEN 500 MG PO TABS
ORAL_TABLET | ORAL | Status: AC
  Filled 2020-09-26: qty 2

## 2020-09-26 MED ORDER — BUPIVACAINE LIPOSOME 1.3 % IJ SUSP
INTRAMUSCULAR | Status: DC | PRN
  Administered 2020-09-26: 10 mL via PERINEURAL

## 2020-09-26 MED ORDER — FENTANYL CITRATE (PF) 100 MCG/2ML IJ SOLN
INTRAMUSCULAR | Status: DC | PRN
  Administered 2020-09-26: 100 ug via INTRAVENOUS

## 2020-09-26 MED ORDER — PROPOFOL 10 MG/ML IV BOLUS
INTRAVENOUS | Status: AC
  Filled 2020-09-26: qty 20

## 2020-09-26 MED ORDER — CELECOXIB 100 MG PO CAPS: 100.0000 mg | ORAL_CAPSULE | Freq: Two times a day (BID) | ORAL | 0 refills | Status: AC

## 2020-09-26 MED ORDER — MIDAZOLAM HCL 2 MG/2ML IJ SOLN
2.0000 mg | Freq: Once | INTRAMUSCULAR | Status: AC
  Administered 2020-09-26: 1 mg via INTRAVENOUS

## 2020-09-26 MED ORDER — SODIUM CHLORIDE 0.9 % IR SOLN
Status: DC | PRN
  Administered 2020-09-26: 12000 mL

## 2020-09-26 SURGICAL SUPPLY — 63 items
AID PSTN UNV HD RSTRNT DISP (MISCELLANEOUS) ×3
ANCH SUT FBRTK 1.3 2 TPE (Anchor) ×9 IMPLANT
ANCH SUT SWLK 19.1X4.75 (Anchor) ×6 IMPLANT
ANCHOR FBRTK 2.6 SUTURETAP 1.3 (Anchor) ×9 IMPLANT
ANCHOR SUT BIO SW 4.75X19.1 (Anchor) ×4 IMPLANT
APL PRP STRL LF DISP 70% ISPRP (MISCELLANEOUS) ×3
BLADE EXCALIBUR 4.0X13 (MISCELLANEOUS) ×4 IMPLANT
BLADE SURG 10 STRL SS (BLADE) IMPLANT
BURR OVAL 8 FLU 4.0X13 (MISCELLANEOUS) ×3 IMPLANT
CANNULA 5.75X71 LONG (CANNULA) IMPLANT
CANNULA PASSPORT 5 (CANNULA) IMPLANT
CANNULA PASSPORT BUTTON 10-40 (CANNULA) ×3 IMPLANT
CANNULA TWIST IN 8.25X7CM (CANNULA) IMPLANT
CHLORAPREP W/TINT 26 (MISCELLANEOUS) ×4 IMPLANT
CLSR STERI-STRIP ANTIMIC 1/2X4 (GAUZE/BANDAGES/DRESSINGS) ×4 IMPLANT
COOLER ICEMAN CLASSIC (MISCELLANEOUS) ×4 IMPLANT
COVER WAND RF STERILE (DRAPES) IMPLANT
DRAPE IMP U-DRAPE 54X76 (DRAPES) ×4 IMPLANT
DRAPE INCISE IOBAN 66X45 STRL (DRAPES) IMPLANT
DRAPE SHOULDER BEACH CHAIR (DRAPES) ×4 IMPLANT
DRSG PAD ABDOMINAL 8X10 ST (GAUZE/BANDAGES/DRESSINGS) ×4 IMPLANT
DW OUTFLOW CASSETTE/TUBE SET (MISCELLANEOUS) ×4 IMPLANT
GAUZE SPONGE 4X4 12PLY STRL (GAUZE/BANDAGES/DRESSINGS) ×4 IMPLANT
GLOVE SRG 8 PF TXTR STRL LF DI (GLOVE) ×3 IMPLANT
GLOVE SURG ENC MOIS LTX SZ6.5 (GLOVE) ×4 IMPLANT
GLOVE SURG LTX SZ8 (GLOVE) ×4 IMPLANT
GLOVE SURG UNDER POLY LF SZ6.5 (GLOVE) ×2 IMPLANT
GLOVE SURG UNDER POLY LF SZ7 (GLOVE) ×4 IMPLANT
GLOVE SURG UNDER POLY LF SZ8 (GLOVE) ×4
GOWN STRL REUS W/ TWL LRG LVL3 (GOWN DISPOSABLE) ×5 IMPLANT
GOWN STRL REUS W/TWL LRG LVL3 (GOWN DISPOSABLE) ×4
GOWN STRL REUS W/TWL XL LVL3 (GOWN DISPOSABLE) ×4 IMPLANT
IV NS IRRIG 3000ML ARTHROMATIC (IV SOLUTION) ×8 IMPLANT
KIT POSITIONER SHLDR ASSISTARM (MISCELLANEOUS) ×3 IMPLANT
KIT STABILIZATION SHOULDER (MISCELLANEOUS) ×2 IMPLANT
KIT STR SPEAR 1.8 FBRTK DISP (KITS) IMPLANT
LASSO CRESCENT QUICKPASS (SUTURE) IMPLANT
MANIFOLD NEPTUNE II (INSTRUMENTS) ×4 IMPLANT
NDL SAFETY ECLIPSE 18X1.5 (NEEDLE) ×3 IMPLANT
NDL SCORPION MULTI FIRE (NEEDLE) IMPLANT
NEEDLE HYPO 18GX1.5 SHARP (NEEDLE) ×4
NEEDLE SCORPION MULTI FIRE (NEEDLE) IMPLANT
PACK ARTHROSCOPY DSU (CUSTOM PROCEDURE TRAY) ×4 IMPLANT
PACK BASIN DAY SURGERY FS (CUSTOM PROCEDURE TRAY) ×4 IMPLANT
PAD COLD SHLDR WRAP-ON (PAD) ×4 IMPLANT
PORT APPOLLO RF 90DEGREE MULTI (SURGICAL WAND) ×4 IMPLANT
RESTRAINT HEAD UNIVERSAL NS (MISCELLANEOUS) ×4 IMPLANT
SHEET MEDIUM DRAPE 40X70 STRL (DRAPES) ×2 IMPLANT
SLEEVE SCD COMPRESS KNEE MED (STOCKING) ×4 IMPLANT
SLING ARM FOAM STRAP LRG (SOFTGOODS) IMPLANT
SUT FIBERWIRE #2 38 T-5 BLUE (SUTURE)
SUT MNCRL AB 4-0 PS2 18 (SUTURE) ×4 IMPLANT
SUT PDS AB 1 CT  36 (SUTURE)
SUT PDS AB 1 CT 36 (SUTURE) IMPLANT
SUT TIGER TAPE 7 IN WHITE (SUTURE) IMPLANT
SUTURE FIBERWR #2 38 T-5 BLUE (SUTURE) IMPLANT
SUTURE TAPE TIGERLINK 1.3MM BL (SUTURE) IMPLANT
SUTURETAPE TIGERLINK 1.3MM BL (SUTURE)
SYR 5ML LL (SYRINGE) ×2 IMPLANT
TAPE FIBER 2MM 7IN #2 BLUE (SUTURE) IMPLANT
TOWEL GREEN STERILE FF (TOWEL DISPOSABLE) ×8 IMPLANT
TUBE CONNECTING 20X1/4 (TUBING) ×4 IMPLANT
TUBING ARTHROSCOPY IRRIG 16FT (MISCELLANEOUS) ×4 IMPLANT

## 2020-09-26 NOTE — Anesthesia Procedure Notes (Signed)
Anesthesia Regional Block: Interscalene brachial plexus block   Pre-Anesthetic Checklist: , timeout performed,  Correct Patient, Correct Site, Correct Laterality,  Correct Procedure, Correct Position, site marked,  Risks and benefits discussed,  Pre-op evaluation,  At surgeon's request and post-op pain management  Laterality: Right  Prep: Maximum Sterile Barrier Precautions used, chloraprep       Needles:  Injection technique: Single-shot  Needle Type: Echogenic Stimulator Needle     Needle Length: 4cm  Needle Gauge: 22     Additional Needles:   Procedures:,,,, ultrasound used (permanent image in chart),,    Narrative:  Start time: 09/26/2020 10:58 AM End time: 09/26/2020 11:01 AM Injection made incrementally with aspirations every 5 mL.  Performed by: Personally  Anesthesiologist: Kaylyn Layer, MD  Additional Notes: Risks, benefits, and alternative discussed. Patient gave consent for procedure. Patient prepped and draped in sterile fashion. Sedation administered, patient remains easily responsive to voice. Relevant anatomy identified with ultrasound guidance. Local anesthetic given in 5cc increments with no signs or symptoms of intravascular injection. No pain or paraesthesias with injection. Patient monitored throughout procedure with signs of LAST or immediate complications. Tolerated well. Ultrasound image placed in chart.  Amalia Greenhouse, MD

## 2020-09-26 NOTE — Progress Notes (Signed)
Assisted Dr. Howze with right, ultrasound guided, interscalene  block. Side rails up, monitors on throughout procedure. See vital signs in flow sheet. Tolerated Procedure well. °

## 2020-09-26 NOTE — Transfer of Care (Signed)
Immediate Anesthesia Transfer of Care Note  Patient: Christina Richards  Procedure(s) Performed: SHOULDER ARTHROSCOPY DEBRIDEMENT WITH SUBACROMIAL DECOMPRESSION AND DISTAL CLAVICLE EXCISION AND ARTHROSCOPIC REVISION ROTATOR CUFF REPAIR (Right: Shoulder) ARTHROSCOPIC RESECTION DISTAL CLAVICAL (Shoulder)  Patient Location: PACU  Anesthesia Type:General  Level of Consciousness: sedated  Airway & Oxygen Therapy: Patient Spontanous Breathing and Patient connected to face mask oxygen  Post-op Assessment: Report given to RN and Post -op Vital signs reviewed and stable  Post vital signs: Reviewed and stable  Last Vitals:  Vitals Value Taken Time  BP 165/82 09/26/20 1320  Temp    Pulse 70 09/26/20 1323  Resp 15 09/26/20 1323  SpO2 100 % 09/26/20 1323  Vitals shown include unvalidated device data.  Last Pain:  Vitals:   09/26/20 1023  PainSc: 8       Patients Stated Pain Goal: 5 (09/26/20 1023)  Complications: No notable events documented.

## 2020-09-26 NOTE — Anesthesia Postprocedure Evaluation (Signed)
Anesthesia Post Note  Patient: Christina Richards  Procedure(s) Performed: SHOULDER ARTHROSCOPY DEBRIDEMENT WITH SUBACROMIAL DECOMPRESSION AND DISTAL CLAVICLE EXCISION AND ARTHROSCOPIC REVISION ROTATOR CUFF REPAIR (Right: Shoulder) ARTHROSCOPIC RESECTION DISTAL CLAVICAL (Shoulder)     Patient location during evaluation: PACU Anesthesia Type: General Level of consciousness: awake and alert and oriented Pain management: pain level controlled Vital Signs Assessment: post-procedure vital signs reviewed and stable Respiratory status: spontaneous breathing, nonlabored ventilation and respiratory function stable Cardiovascular status: blood pressure returned to baseline Postop Assessment: no apparent nausea or vomiting Anesthetic complications: no   No notable events documented.  Last Vitals:  Vitals:   09/26/20 1335 09/26/20 1345  BP:  (!) 148/85  Pulse: 67 71  Resp: 14 (!) 22  Temp:    SpO2: 100% 97%               Kaylyn Layer

## 2020-09-26 NOTE — Anesthesia Procedure Notes (Addendum)
Procedure Name: Intubation Date/Time: 09/26/2020 11:45 AM Performed by: Maryella Shivers, CRNA Pre-anesthesia Checklist: Patient identified, Emergency Drugs available, Suction available and Patient being monitored Patient Re-evaluated:Patient Re-evaluated prior to induction Oxygen Delivery Method: Circle system utilized Preoxygenation: Pre-oxygenation with 100% oxygen Induction Type: IV induction Ventilation: Two handed mask ventilation required Laryngoscope Size: Mac and 4 Grade View: Grade I Tube type: Oral Tube size: 7.0 mm Number of attempts: 1 Airway Equipment and Method: Stylet Placement Confirmation: ETT inserted through vocal cords under direct vision, positive ETCO2 and breath sounds checked- equal and bilateral Secured at: 20 cm Tube secured with: Tape Dental Injury: Teeth and Oropharynx as per pre-operative assessment

## 2020-09-26 NOTE — Interval H&P Note (Signed)
History and Physical Interval Note:  09/26/2020 11:34 AM  Christina Richards  has presented today for surgery, with the diagnosis of RIGHT SHOULDER PRIMARY OSTEOARTHRITIS IMPINGEMENT AND ROTATOR CUFF STRAIN.  The various methods of treatment have been discussed with the patient and family. After consideration of risks, benefits and other options for treatment, the patient has consented to  Procedure(s): SHOULDER ARTHROSCOPY DEBRIDEMENT WITH SUBACROMIAL DECOMPRESSION AND DISTAL CLAVICLE EXCISION AND ROTATOR CUFF REPAIR (Right) as a surgical intervention.  The patient's history has been reviewed, patient examined, no change in status, stable for surgery.  I have reviewed the patient's chart and labs.  Questions were answered to the patient's satisfaction.     Bjorn Pippin

## 2020-09-26 NOTE — Anesthesia Preprocedure Evaluation (Addendum)
Anesthesia Evaluation  Patient identified by MRN, date of birth, ID band Patient awake    Reviewed: Allergy & Precautions, NPO status , Patient's Chart, lab work & pertinent test results  History of Anesthesia Complications Negative for: history of anesthetic complications  Airway Mallampati: II  TM Distance: >3 FB Neck ROM: Full    Dental  (+) Missing,    Pulmonary sleep apnea (noncompliant with CPAP) , Current Smoker,    Pulmonary exam normal        Cardiovascular hypertension, Pt. on medications + CAD  Normal cardiovascular exam     Neuro/Psych Anxiety Depression Bipolar Disorder negative neurological ROS     GI/Hepatic GERD  ,(+)     substance abuse (1 week ago)  cocaine use,   Endo/Other  Morbid obesity  Renal/GU negative Renal ROS  negative genitourinary   Musculoskeletal negative musculoskeletal ROS (+)   Abdominal   Peds  Hematology negative hematology ROS (+)   Anesthesia Other Findings Day of surgery medications reviewed with patient.  Reproductive/Obstetrics negative OB ROS                            Anesthesia Physical Anesthesia Plan  ASA: 3  Anesthesia Plan: General   Post-op Pain Management: GA combined w/ Regional for post-op pain   Induction: Intravenous  PONV Risk Score and Plan: 3 and Treatment may vary due to age or medical condition, Ondansetron, Dexamethasone and Midazolam  Airway Management Planned: Oral ETT  Additional Equipment: None  Intra-op Plan:   Post-operative Plan: Extubation in OR  Informed Consent: I have reviewed the patients History and Physical, chart, labs and discussed the procedure including the risks, benefits and alternatives for the proposed anesthesia with the patient or authorized representative who has indicated his/her understanding and acceptance.     Dental advisory given  Plan Discussed with: CRNA  Anesthesia Plan  Comments:        Anesthesia Quick Evaluation

## 2020-09-27 ENCOUNTER — Encounter (HOSPITAL_BASED_OUTPATIENT_CLINIC_OR_DEPARTMENT_OTHER): Payer: Self-pay | Admitting: Orthopaedic Surgery

## 2020-09-30 NOTE — Op Note (Signed)
Orthopaedic Surgery Operative Note (CSN: [id number])  Christina Richards  02-Jan-1961 Date of Surgery: 09/26/2020   Diagnoses:  Right shoulder recurrent rotator cuff tear  Procedure: Arthroscopic extensive debridement Arthroscopic subacromial decompression Arthroscopic rotator cuff repair Arthroscopic distal clavicle excision Removal of hardware   Operative Finding Exam under anesthesia: Full motion no limitation in forward flexion, some slight residual external rotation loss improved by release of the [facility] L Articular space: No loose bodies, capsule intact, labrum frayed throughout, biceps rupture noted versus previous tenotomy Chondral surfaces:Intact, no sign of chondral degeneration on the glenoid or humeral head Biceps: Biceps rupture versus previous tenotomy, superior labral debridement Subscapularis: Normal Superior Cuff: Full-thickness retear of the anterior portion of the supraspinatus and undersurface articular sided rotator with only about 20% footprint coverage of the remainder of the supra and leading into the conjoined part of the infra.  The tissue quality was extraordinarily poor anteriorly and there was multiple sutures that had to be removed in order to perform a adequate repair. Bursal side: Type II acromion with lateral spurring that was still residual.  This was converted to type I  Successful completion of the planned procedure.  Patient's tissue quality was poor and will hold for 6 weeks on therapy.  She will do pendulum starting at week 2-3.   Post-operative plan: The patient will be non-weightbearing in a sling.  The patient will be discharged home.  DVT prophylaxis not indicated in ambulatory upper extremity patient without known risk factors.   Pain control with PRN pain medication preferring oral medicines.  Follow up plan will be scheduled in approximately 7 days for incision check and XR.  Post-Op Diagnosis: Same Surgeons:Primary: Bjorn Pippin,  MD Assistants:Caroline McBane PA-C Location: [facility] OR ROOM 6 Anesthesia: General with Exparel interscalene block Antibiotics: Ancef 2 g Tourniquet time: None Estimated Blood Loss: Minimal Complications: None Specimens: None Implants: Implant Name Type Inv. Item Serial No. Manufacturer Lot No. LRB No. Used Action  ANCHOR FBRTK 2.6 SUTURETAP 1.3 - [id number] Anchor ANCHOR FBRTK 2.6 SUTURETAP 1.3  [employer] [id number]  1 Implanted  ANCHOR FBRTK 2.6 SUTURETAP 1.3 - [id number] Anchor ANCHOR FBRTK 2.6 SUTURETAP 1.3  [employer] [id number]  1 Implanted  ANCHOR FBRTK 2.6 SUTURETAP 1.3 - [id number] Anchor ANCHOR FBRTK 2.6 SUTURETAP 1.3  [employer] [id number]  1 Implanted  ANCHOR SUT BIO SW 4.75X19.1 - [id number] Anchor ANCHOR SUT BIO SW 4.75X19.1  [employer] [id number]  1 Implanted  ANCHOR SUT BIO SW 4.75X19.1 - [id number] Anchor ANCHOR SUT BIO SW 4.75X19.1  [employer] [id number]  1 Implanted    Indications for Surgery:   Christina Richards is a 60 y.o. female with continued shoulder pain refractory to nonoperative measures for extended period of time.  She had a previous rotator cuff repair done with a mini open incision in 2017 approximately and unfortunately had retorn.  The risks and benefits were explained at length including but not limited to continued pain, cuff failure, biceps tenodesis failure, stiffness, need for further surgery and infection.   Procedure:   Patient was correctly identified in the preoperative holding area and operative site marked.  Patient brought to OR and positioned beachchair on an Crouse table ensuring that all bony prominences were padded and the head was in an appropriate location.  Anesthesia was induced and the operative shoulder was prepped and draped in the usual sterile fashion.  Timeout was called preincision.  A standard posterior viewing portal was made after localizing the portal with  a spinal needle.  An anterior accessory portal was also made.  After clearing the  articular space the camera was positioned in the subacromial space.  Findings above.    Extensive debridement was performed of the anterior interval tissue, labral fraying and the bursa.  Subacromial decompression: We made a lateral portal with spinal needle guidance. We then proceeded to debride bursal tissue extensively with a shaver and arthrocare device. At that point we continued to identify the borders of the acromion and identify the spur. We then carefully preserved the deltoid fascia and used a burr to convert the acromion to a Type 1 flat acromion without issue.  Distal Clavicle resection:  The scope was placed in the subacromial space from the posterior portal.  A hemostat was placed through the anterior portal and we spread at the [facility] joint.  A burr was then inserted and 10 mm of distal clavicle was resected taking care to avoid damage to the capsule around the joint and avoiding overhanging bone posteriorly.    Rotator cuff repair: The patient had a full-thickness retear of the anterior portion of the supraspinatus and near full-thickness tearing of the posterior supra and anterior infraspinatus.  We used a shaver to debride the bone back to a healthy surface for healing.  We then used a bur to medialize the tuberosity a few millimeters.  We also decorticated to allow for healing.  Marrow vent holes were made with a awl type device.  Based on the previous anchor locations we felt that soft anchors in the form of 2.6 fiber tacks would be more appropriate than an additional swivel locks.  There was limited tuberosity left for healing otherwise.  We mobilized the cuff tissue and remove the multiple nonabsorbable sutures that were remaining.  At this point we used a scorpion device as well as a crescent through the visors portal to pass multiple sutures in mattress fashion.  We tied each of the double loaded sutures 6 total and then took all of the limbs over to lateral anchors 8 to 10 mm below  the tuberosity.  We had great fixation and good compression of the tissue.  The incisions were closed with absorbable monocryl and steri strips.  A sterile dressing was placed along with a sling. The patient was awoken from general anesthesia and taken to the PACU in stable condition without complication.   Alfonse Alpers, PA-C, present and scrubbed throughout the case, critical for completion in a timely fashion, and for retraction, instrumentation, closure.

## 2020-10-14 ENCOUNTER — Ambulatory Visit: Payer: Self-pay | Admitting: Urology

## 2020-10-15 ENCOUNTER — Encounter: Payer: Self-pay | Admitting: Urology

## 2020-11-18 ENCOUNTER — Ambulatory Visit: Payer: Medicaid Other | Admitting: Urology

## 2020-11-19 ENCOUNTER — Encounter: Payer: Self-pay | Admitting: Urology

## 2020-11-25 ENCOUNTER — Other Ambulatory Visit: Payer: Self-pay

## 2020-11-25 ENCOUNTER — Ambulatory Visit: Payer: Medicaid Other | Attending: Sports Medicine

## 2020-11-25 DIAGNOSIS — M25512 Pain in left shoulder: Secondary | ICD-10-CM | POA: Insufficient documentation

## 2020-11-25 DIAGNOSIS — M25511 Pain in right shoulder: Secondary | ICD-10-CM | POA: Insufficient documentation

## 2020-11-25 DIAGNOSIS — G8929 Other chronic pain: Secondary | ICD-10-CM | POA: Insufficient documentation

## 2020-11-25 DIAGNOSIS — M6281 Muscle weakness (generalized): Secondary | ICD-10-CM | POA: Insufficient documentation

## 2020-11-25 NOTE — Therapy (Signed)
[place] [facility] [address] [place], [place], [address] Phone: [phone]   Fax:  [phone]  Physical Therapy Screen  Patient Details  Name: Christina Richards MRN: [id number] Date of Birth: 26-Nov-1960 Referring Provider (PT): Ramond Marrow, MD  Encounter Date: 11/25/2020   PT End of Session - 11/25/20 0850     Visit Number 1    Number of Visits 25    Date for PT Re-Evaluation 02/20/21    PT Start Time 0850    PT Stop Time 0937    PT Time Calculation (min) 47 min    Activity Tolerance Patient tolerated treatment well    Behavior During Therapy [facility] for tasks assessed/performed             Past Medical History:  Diagnosis Date   Allergy    Environmental   Anemia    Anxiety    Bipolar disorder (HCC)    Cocaine abuse (HCC)    smokes crack   Coronary artery disease    Depression    GERD (gastroesophageal reflux disease)    History of borderline personality disorder    History of rotator cuff surgery 05/09/2017   right shoulder   Hypercholesteremia    Hypertension    Sleep apnea    does not use CPAP    Past Surgical History:  Procedure Laterality Date   ECTOPIC PREGNANCY SURGERY     HAND SURGERY Right    thumb fusion   RESECTION DISTAL CLAVICAL  09/26/2020   Procedure: ARTHROSCOPIC RESECTION DISTAL CLAVICAL;  Surgeon: Bjorn Pippin, MD;  Location: [facility];  Service: Orthopedics;;   ROTATOR CUFF REPAIR     SHOULDER ARTHROSCOPY WITH ROTATOR CUFF REPAIR AND SUBACROMIAL DECOMPRESSION Right 09/26/2020   Procedure: SHOULDER ARTHROSCOPY DEBRIDEMENT WITH SUBACROMIAL DECOMPRESSION AND DISTAL CLAVICLE EXCISION AND ARTHROSCOPIC REVISION ROTATOR CUFF REPAIR;  Surgeon: Bjorn Pippin, MD;  Location:  SURGERY CENTER;  Service: Orthopedics;  Laterality: Right;    There were no vitals filed for this visit.    Subjective Assessment - 11/25/20 0853     Subjective R shoulder 10/10 currently and at  worst. L shoulder joint 10/10 currently and at most, and L UE radial nerve distribution: 7/10 currently (not bothering her as bad as it usually does), 10/10 at most for the past 2 weeks. Current pain levels reported does not match clinical presentation.    Pertinent History S/P R rotator cuff repair on 09/26/2020 and L shoulder pain. Pt also having L shoulder pain secondary to using it more for R surgery healing. Pt stopped wearing her sling November 06, 2020 due to the velcro causing sores on her skin. MD office inoformed and they said that it is ok. Feels like R upper trap is pulling with R anterior shoulder discomfort. MD office informed as well. Has not yet had PT since surgery. The only things she has been doing is washing dishes, reaching, and lifting a clothes bag. Pt writes wiht R hand but is ambidextrous. Pt uses her R UE because she lives by herself and does not have anyone to help her.    Patient Stated Goals Be able to get back to be able to lift where its not hurting her so bad at some level. No pain if possible.    Currently in Pain? Yes    Pain Score 10-Worst pain ever    Pain Location Shoulder    Pain Orientation Right;Left  Pain Type Chronic pain    Pain Radiating Towards L UE: radial nerve distribution to hand    Pain Onset More than a month ago    Pain Frequency Constant    Aggravating Factors  lifting, reaching back    Pain Relieving Factors rest, icy hot, muscle relaxers, and pain pill (voltaren cream and pills 75 mg)                [facility] PT Assessment - 11/25/20 0902       Assessment   Medical Diagnosis S/P R rotator cuff repair, subacromial decompression, distal clavicle excision on 09/26/2020 as well as L shoulder pain.    Referring Provider (PT) Ramond Marrow, MD    Onset Date/Surgical Date 09/26/20    Hand Dominance Right   Pt states being ambidextrous   Prior Therapy None for current surgery.      Precautions   Precaution Comments Protocol      Restrictions    Other Position/Activity Restrictions Protocol      Balance Screen   Has the patient fallen in the past 6 months No    Has the patient had a decrease in activity level because of a fear of falling?  No    Is the patient reluctant to leave their home because of a fear of falling?  No      Home Environment   Additional Comments Pt lives alone in a 1 story home with 4-5 steps front door, Rail is not sturdy.      Observation/Other Assessments   Focus on Therapeutic Outcomes (FOTO)  Shoulder FOTO 34      AROM   Left Shoulder Flexion 115 Degrees   with pain, AAROM 142 with pain, empty end feel. L anterior shoulder pain.   Left Shoulder ABduction 64 Degrees   with pain, AAROM 92 degrees, empty end feel.     Strength   Overall Strength Comments R UE strength not yet tested secondary surgery to allow for more healing.                        Objective measurements completed on examination: See above findings.   Pt observed to move her R arm, reach, and carry items. No sling worn.  Pt was recommended to hold off reaching with R arm, using her R arm to lift the heavy laundry bags secondary to poor tissue quality (per surgeon operative note) and to allow for healing unless the surgeon says otherwise. Pt verbalized understanding    Blood pressure is controlled via medication.  No latex allergies.    Pt states not taking her BP medicine this morning trying to make it to her PT appointment.   Blood pressure L arm sitting, large cuff, mechanically taken: 182/109, HR 68.   Pt Eval R UE measurements held off secondary to high blood pressure levels and due to pt forgetting to take her medicine.   SCREEN Time taken earlier to listen to pt concerns and subjective  Response to treatment Pt tolerated session well without aggravation of symptoms.   Clinical impression. Pt is a 60 year old female who came to physical therapy secondary to S/P R rotator cuff repair, subacromial  decompression, distal clavicle excision on 09/26/2020 as well as L shoulder pain. Physical therapy eval measurements held off secondary to high blood pressure level with patient reporting forgetting to take her medicine this morning. Physical therapy evaluation to continue next session when blood pressure levels  are appropriate for physical therapy.                               Plan - 11/25/20 0950     Clinical Impression Statement Pt is a 60 year old female who came to physical therapy secondary to S/P R rotator cuff repair, subacromial decompression, distal clavicle excision as well as L shoulder pain. Physical therapy eval measurements held off secondary to high blood pressure level with patient reporting forgetting to take her medicine this morning. Physical therapy evaluation to continue next session when blood pressure levels are appropriate for physical therapy.    Personal Factors and Comorbidities Comorbidity 3+;Education;Finances;Fitness;Past/Current Experience;Social Background;Time since onset of injury/illness/exacerbation    Comorbidities Axiety, bipolar disorder, cocaine abuse, depression, hx of borderline personality disorder, hx of R rotator cuff surgery 2019, HTN    Examination-Activity Limitations Bathing;Lift;Toileting;Reach Overhead;Self Feeding;Carry;Hygiene/Grooming    Stability/Clinical Decision Making Stable/Uncomplicated    Clinical Decision Making Low    Rehab Potential Fair    PT Frequency 2x / week    PT Duration 12 weeks             Patient will benefit from skilled therapeutic intervention in order to improve the following deficits and impairments:  Pain, Improper body mechanics, Postural dysfunction, Impaired UE functional use, Decreased strength, Decreased range of motion  Visit Diagnosis: Chronic right shoulder pain  Chronic left shoulder pain     Problem List Patient Active Problem List   Diagnosis Date Noted   Depression,  recurrent (HCC) 11/01/2019   Encounter for screening mammogram for malignant neoplasm of breast 11/01/2019   Stress incontinence of urine 10/11/2019   Encounter for general adult medical examination with abnormal findings 06/28/2019   Chronic otitis externa of both ears 06/28/2019   Irritable bowel syndrome with both constipation and diarrhea 06/28/2019   Exposure to STD 06/28/2019   Routine cervical smear 06/28/2019   Dysuria 06/28/2019   Illicit drug use 06/28/2019   Hyperlipidemia 01/19/2019   Depression, major, single episode, moderate (HCC) 01/19/2019   Cocaine dependence without complication (HCC) 01/19/2019   Chronic low back pain without sciatica 07/25/2017   Acute vaginitis 05/05/2017   Candidiasis of vulva and vagina 05/05/2017   Acute sinusitis, unspecified 05/05/2017   Essential (primary) hypertension 05/05/2017   Body mass index 45.0-49.9, adult (HCC) 05/05/2017   Status post rotator cuff repair 07/17/2014   Complete tear of right rotator cuff 01/04/2014    Thank you for your referral.  Loralyn Freshwater PT, DPT   11/25/2020, 10:06 AM  [place] [facility] [address] [place], [address] Phone: [phone]   Fax:  [phone]  Name: HAYLEEN CLINKSCALES MRN: [id number] Date of Birth: October 16, 1960

## 2020-11-27 ENCOUNTER — Ambulatory Visit: Payer: Medicaid Other

## 2020-11-27 VITALS — BP 136/79

## 2020-11-27 DIAGNOSIS — M6281 Muscle weakness (generalized): Secondary | ICD-10-CM

## 2020-11-27 DIAGNOSIS — G8929 Other chronic pain: Secondary | ICD-10-CM | POA: Diagnosis present

## 2020-11-27 DIAGNOSIS — M25511 Pain in right shoulder: Secondary | ICD-10-CM | POA: Diagnosis present

## 2020-11-27 DIAGNOSIS — M25512 Pain in left shoulder: Secondary | ICD-10-CM

## 2020-11-27 NOTE — Patient Instructions (Signed)
Access Code: [email or web address] URL: [email or web address] Date: 11/27/2020 Prepared by: Loralyn Freshwater  Exercises Flexion-Extension Shoulder Pendulum with Table Support - 3 x daily - 7 x weekly - 3 sets - 10 reps

## 2020-11-27 NOTE — Therapy (Signed)
[facility] [address] [place], [place], [address] Phone: [phone]   Fax:  [phone]  Physical Therapy Evaluation  Patient Details  Name: Christina Richards MRN: [id number] Date of Birth: 1960-07-27 Referring Provider (PT): Ramond Marrow, MD   Encounter Date: 11/27/2020   PT End of Session - 11/27/20 0852     Visit Number 1    Number of Visits 25    Date for PT Re-Evaluation 02/20/21    PT Start Time 0852    PT Stop Time 0929    PT Time Calculation (min) 37 min    Activity Tolerance Patient tolerated treatment well;Patient limited by pain    Behavior During Therapy [facility] for tasks assessed/performed             Past Medical History:  Diagnosis Date   Allergy    Environmental   Anemia    Anxiety    Bipolar disorder (HCC)    Cocaine abuse (HCC)    smokes crack   Coronary artery disease    Depression    GERD (gastroesophageal reflux disease)    History of borderline personality disorder    History of rotator cuff surgery 05/09/2017   right shoulder   Hypercholesteremia    Hypertension    Sleep apnea    does not use CPAP    Past Surgical History:  Procedure Laterality Date   ECTOPIC PREGNANCY SURGERY     HAND SURGERY Right    thumb fusion   RESECTION DISTAL CLAVICAL  09/26/2020   Procedure: ARTHROSCOPIC RESECTION DISTAL CLAVICAL;  Surgeon: Bjorn Pippin, MD;  Location: [facility];  Service: Orthopedics;;   ROTATOR CUFF REPAIR     SHOULDER ARTHROSCOPY WITH ROTATOR CUFF REPAIR AND SUBACROMIAL DECOMPRESSION Right 09/26/2020   Procedure: SHOULDER ARTHROSCOPY DEBRIDEMENT WITH SUBACROMIAL DECOMPRESSION AND DISTAL CLAVICLE EXCISION AND ARTHROSCOPIC REVISION ROTATOR CUFF REPAIR;  Surgeon: Bjorn Pippin, MD;  Location: [facility];  Service: Orthopedics;  Laterality: Right;    Vitals:   11/27/20 0856  BP: 136/79      Subjective Assessment - 11/27/20 0854     Subjective Took  her blood pressure medicine this morning. Hopefully its down but does not think so.    Pertinent History S/P R rotator cuff repair on 09/26/2020 and L shoulder pain. Pt also having L shoulder pain secondary to using it more for R surgery healing. Pt stopped wearing her sling November 06, 2020 due to the velcro causing sores on her skin. MD office inoformed and they said that it is ok. Feels like R upper trap is pulling with R anterior shoulder discomfort. MD office informed as well. Has not yet had PT since surgery. The only things she has been doing is washing dishes, reaching, and lifting a clothes bag. Pt writes wiht R hand but is ambidextrous. Pt uses her R UE because she lives by herself and does not have anyone to help her.    Patient Stated Goals Be able to get back to be able to lift where its not hurting her so bad at some level. No pain if possible.    Currently in Pain? Yes   No pain level provided   Pain Onset More than a month ago                [facility] PT Assessment - 11/27/20 0903       Assessment   Medical Diagnosis S/P R rotator  cuff repair, subacromial decompression, distal clavicle excision on 09/26/2020 as well as L shoulder pain.    Referring Provider (PT) Ramond Marrowax Varkey, MD    Onset Date/Surgical Date 09/26/20    Hand Dominance Right   Pt states being ambidextrous   Prior Therapy None for current surgery.      Precautions   Precaution Comments Protocol      Restrictions   Other Position/Activity Restrictions Protocol      Balance Screen   Has the patient fallen in the past 6 months No    Has the patient had a decrease in activity level because of a fear of falling?  No    Is the patient reluctant to leave their home because of a fear of falling?  No      Home Environment   Additional Comments Pt lives alone in a 1 story home with 4-5 steps front door, Rail is not sturdy.      Observation/Other Assessments   Focus on Therapeutic Outcomes (FOTO)  Shoulder FOTO 34    measured 11/25/2020     AROM   Left Shoulder Flexion 115 Degrees   with pain, AAROM 142 with pain, empty end feel. L anterior shoulder pain.   Left Shoulder ABduction 64 Degrees   with pain, AAROM 92 degrees, empty end feel.     PROM   Right Shoulder Flexion 105 Degrees   supine   Right Shoulder ABduction 45 Degrees   supine   Right Shoulder External Rotation 40 Degrees   supine, arm at side, pillow propped in neutral     Strength   Overall Strength Comments R UE strength not yet tested secondary surgery to allow for more healing.                           Objective measurements completed on examination: See above findings.      No latex allergies.    DOS: 09/26/2020  Blood pressure L arm sitting, large cuff, mechanically taken: 182/109, HR 68.   PROM flexion 140 degrees ER 40 degrees at side, Abduction 60 degrees per protocol first 6 weeks   Had to use both hands the other day to change her sheets  Medbridge Access Code [email or web address]  Therapeutic exercise  Supine PROM R UE with arm propped to neutral  Flexion 2x. Difficulty secondary to pain and guarding  Abduction 10x3  ER at side 10x3  Pendulums   Flexion/extnension 1 min then 30 seconds   Reviewed and given as part of her HEP. Pt demonstrated and verbalized understanding. Handout provided.    Max cues to not activate muscles for PROM Improved exercise technique, movement at target joints, use of target muscles after mod verbal, visual, tactile cues.     Response to treatment Pt tolerated session well without aggravation of symptoms.    Clinical impression. Pt is a 60 year old female who came to physical therapy S/P R rotator cuff repair, subacromial decompression, distal clavicle excision on 09/26/2020. She also presents with R and L shoulder pain, limited R shoulder PROM, increased muscle guarding, difficulty adhering to precautions secondary to home situation, and difficulty performing tasks such as  reaching and lifting. Pt will benefit from skilled physical therapy services to address the aforementioned deficits.                 PT Education - 11/27/20 1238     Education Details ther-ex, HEP, PROM  Person(s) Educated Patient    Methods Explanation;Demonstration;Tactile cues;Verbal cues;Handout    Comprehension Returned demonstration;Verbalized understanding;Verbal cues required;Tactile cues required;Need further instruction              PT Short Term Goals - 11/27/20 1255       PT SHORT TERM GOAL #1   Title Pt will be independent with her initial HEP to improve ROM, strength and function.    Baseline Pt has started her HEP. (11/27/2020)    Time 3    Period Weeks    Status New    Target Date 12/19/20               PT Long Term Goals - 11/27/20 1257       PT LONG TERM GOAL #1   Title Pt will acheive at least 145 degrees flexion, 120 degrees abduction, 80 degrees ER and IR PROM to promote ability to reach when appropriate.    Baseline PROM R shoulder 105 degrees flexion, 45 degrees abduction, 40 degrees ER (11/27/2020)    Time 12    Period Weeks    Status New    Target Date 02/20/21      PT LONG TERM GOAL #2   Title Pt will acheive at least 140 degrees flexion, 115 degrees abduction, 80 degrees ER and IR AROM to promote ability to reach when appropriate.    Baseline PROM R shoulder 105 degrees flexion, 45 degrees abduction, 40 degrees ER (11/27/2020)    Time 12    Period Weeks    Status New    Target Date 02/20/21      PT LONG TERM GOAL #3   Title Pt will have at least 4/5 R shoulder flexion, abduction, ER, IR strength to promote ability to use her R UE for tasks when appropriate.    Baseline R shoulder strength not tested yet to promote more healing (11/27/2020)    Time 12    Period Weeks    Status New    Target Date 02/20/21      PT LONG TERM GOAL #4   Title Pt will have a decrease in L shoulder and UE pain to 5/10 or less at worst to  promote ability to reach, carry, perform functional tasks with her L UE with less difficulty.    Baseline 10/10 L shoulder and UE at worst (11/25/2020)    Time 12    Period Weeks    Status New    Target Date 02/20/21      PT LONG TERM GOAL #5   Title Pt will improve her FOTO score by at least 10 points as a demonstration of improved function.    Baseline Shoulder FOTO 34 (11/25/2020)    Time 12    Period Weeks    Status New    Target Date 02/20/21                    Plan - 11/27/20 1239     Clinical Impression Statement Pt is a 60 year old female who came to physical therapy S/P R rotator cuff repair, subacromial decompression, distal clavicle excision on 09/26/2020. She also presents with R and L shoulder pain, limited R shoulder PROM, increased muscle guarding, difficulty adhering to precautions secondary to home situation, and difficulty performing tasks such as reaching and lifting. Pt will benefit from skilled physical therapy services to address the aforementioned deficits.    Personal Factors and Comorbidities Comorbidity 3+;Education;Finances;Fitness;Past/Current Experience;Social Background;Time since onset  of injury/illness/exacerbation    Comorbidities Axiety, bipolar disorder, cocaine abuse, depression, hx of borderline personality disorder, hx of R rotator cuff surgery 2019, HTN    Examination-Activity Limitations Bathing;Lift;Toileting;Reach Overhead;Self Feeding;Carry;Hygiene/Grooming    Stability/Clinical Decision Making Stable/Uncomplicated    Clinical Decision Making Low    Rehab Potential Fair    PT Frequency 2x / week    PT Duration 12 weeks    PT Treatment/Interventions Therapeutic activities;Therapeutic exercise;Neuromuscular re-education;Patient/family education;Manual techniques;Dry needling;Aquatic Therapy;Electrical Stimulation;Iontophoresis 4mg /ml Dexamethasone    PT Next Visit Plan Follow surgeon's protocol    PT Home Exercise Plan Medbridge Access Code  [email or web address]    Consulted and Agree with Plan of Care Patient             Patient will benefit from skilled therapeutic intervention in order to improve the following deficits and impairments:  Pain, Improper body mechanics, Postural dysfunction, Impaired UE functional use, Decreased strength, Decreased range of motion  Visit Diagnosis: Chronic right shoulder pain - Plan: PT plan of care cert/re-cert  Chronic left shoulder pain - Plan: PT plan of care cert/re-cert  Muscle weakness (generalized) - Plan: PT plan of care cert/re-cert     Problem List Patient Active Problem List   Diagnosis Date Noted   Depression, recurrent (HCC) 11/01/2019   Encounter for screening mammogram for malignant neoplasm of breast 11/01/2019   Stress incontinence of urine 10/11/2019   Encounter for general adult medical examination with abnormal findings 06/28/2019   Chronic otitis externa of both ears 06/28/2019   Irritable bowel syndrome with both constipation and diarrhea 06/28/2019   Exposure to STD 06/28/2019   Routine cervical smear 06/28/2019   Dysuria 06/28/2019   Illicit drug use 06/28/2019   Hyperlipidemia 01/19/2019   Depression, major, single episode, moderate (HCC) 01/19/2019   Cocaine dependence without complication (HCC) 01/19/2019   Chronic low back pain without sciatica 07/25/2017   Acute vaginitis 05/05/2017   Candidiasis of vulva and vagina 05/05/2017   Acute sinusitis, unspecified 05/05/2017   Essential (primary) hypertension 05/05/2017   Body mass index 45.0-49.9, adult (HCC) 05/05/2017   Status post rotator cuff repair 07/17/2014   Complete tear of right rotator cuff 01/04/2014    01/06/2014 PT, DPT   11/27/2020, 1:22 PM  [facility] [address], [address], [place] Phone: [phone]   Fax:  [phone]  Name: NAHDIA DOUCET MRN: [name] Date of Birth: Oct 20, 1960

## 2020-12-02 ENCOUNTER — Ambulatory Visit: Payer: Medicaid Other

## 2020-12-04 ENCOUNTER — Ambulatory Visit: Payer: Medicaid Other

## 2020-12-04 DIAGNOSIS — G8929 Other chronic pain: Secondary | ICD-10-CM

## 2020-12-04 DIAGNOSIS — M25511 Pain in right shoulder: Secondary | ICD-10-CM | POA: Diagnosis not present

## 2020-12-04 DIAGNOSIS — M6281 Muscle weakness (generalized): Secondary | ICD-10-CM

## 2020-12-04 NOTE — Therapy (Signed)
[facility] [address] [place], [place], [address] Phone: [phone]   Fax:  [phone]  Physical Therapy Treatment  Patient Details  Name: Christina Richards MRN: [id number] Date of Birth: [birth year] Referring Provider (PT): Ramond Marrow, MD   Encounter Date: 12/04/2020   PT End of Session - 12/04/20 0851     Visit Number 2    Number of Visits 25    Date for PT Re-Evaluation 02/20/21    PT Start Time 0851    PT Stop Time 0924    PT Time Calculation (min) 33 min    Activity Tolerance Patient tolerated treatment well;Patient limited by pain    Behavior During Therapy [facility] for tasks assessed/performed             Past Medical History:  Diagnosis Date   Allergy    Environmental   Anemia    Anxiety    Bipolar disorder (HCC)    Cocaine abuse (HCC)    smokes crack   Coronary artery disease    Depression    GERD (gastroesophageal reflux disease)    History of borderline personality disorder    History of rotator cuff surgery 05/09/2017   right shoulder   Hypercholesteremia    Hypertension    Sleep apnea    does not use CPAP    Past Surgical History:  Procedure Laterality Date   ECTOPIC PREGNANCY SURGERY     HAND SURGERY Right    thumb fusion   RESECTION DISTAL CLAVICAL  09/26/2020   Procedure: ARTHROSCOPIC RESECTION DISTAL CLAVICAL;  Surgeon: Bjorn Pippin, MD;  Location: [facility];  Service: Orthopedics;;   ROTATOR CUFF REPAIR     SHOULDER ARTHROSCOPY WITH ROTATOR CUFF REPAIR AND SUBACROMIAL DECOMPRESSION Right 09/26/2020   Procedure: SHOULDER ARTHROSCOPY DEBRIDEMENT WITH SUBACROMIAL DECOMPRESSION AND DISTAL CLAVICLE EXCISION AND ARTHROSCOPIC REVISION ROTATOR CUFF REPAIR;  Surgeon: Bjorn Pippin, MD;  Location:  SUR[facility]ERY [facility];  Service: Orthopedics;  Laterality: Right;    There were no vitals filed for this visit.   Subjective Assessment - 12/04/20 0852     Subjective Did  not come Monday because her shoulder was hurting a lot. Also used her R UE to comb her hair. R shoulder is hurting. R shoulder is over a 10/10 currently.    Pertinent History S/P R rotator cuff repair on 09/26/2020 and L shoulder pain. Pt also having L shoulder pain secondary to using it more for R surgery healing. Pt stopped wearing her sling November 06, 2020 due to the velcro causing sores on her skin. MD office inoformed and they said that it is ok. Feels like R upper trap is pulling with R anterior shoulder discomfort. MD office informed as well. Has not yet had PT since surgery. The only things she has been doing is washing dishes, reaching, and lifting a clothes bag. Pt writes wiht R hand but is ambidextrous. Pt uses her R UE because she lives by herself and does not have anyone to help her.    Patient Stated Goals Be able to get back to be able to lift where its not hurting her so bad at some level. No pain if possible.    Currently in Pain? Yes    Pain Score 10-Worst pain ever   Pain level reported does not match clinical presentation observed.   Pain Onset More than a month ago  PT Education - 12/04/20 0942     Education Details ther-ex    Person(s) Educated Patient    Methods Explanation;Demonstration;Tactile cues;Verbal cues    Comprehension Returned demonstration;Verbalized understanding            Objective    No latex allergies.    DOS: 09/26/2020      PROM flexion 140 degrees ER 40 degrees at side, Abduction 60 degrees per protocol first 6 weeks     Had to use both hands the other day to change her sheets   Medbridge Access Code [email or web address]   Therapeutic exercise   Pendulums              Flexion/extnension 1 min then 30 seconds              Reviewed and given as part of her HEP. Pt demonstrated and verbalized understanding. Handout provided.    Seated table slides R UE PROM  Flexion 10x3  Scaption  10x3  Seated with R arm propped on table into about 45 degrees flexion   B scapular retraction 10x3  Seated R shoulder ER PROM with arm at side 10x3     Max cues to not activate muscles for PROM and to decrease muscle guarding Improved exercise technique, movement at target joints, use of target muscles and decreasing shoulder shrug after mod verbal, visual, tactile cues.      Manual therapy Seated STM R upper trap muscle to decrease tension. Proximal to scapula.     Response to treatment Fair tolerance to today's session.    Clinical impression. Pt currently about 10 weeks post op. Continued working on PROM (secondary to late start to PT due to needing more time for tissue healing) to decrease stiffness. Cues needed to decrease muscle guarding and shoulder shrug. Pt demonstrates sensitivity to pain. Fair tolerance to today's session. Pt will benefit from continued skilled physical therapy services to decrease pain, improve strength and function.          PT Short Term Goals - 11/27/20 1255       PT SHORT TERM GOAL #1   Title Pt will be independent with her initial HEP to improve ROM, strength and function.    Baseline Pt has started her HEP. (11/27/2020)    Time 3    Period Weeks    Status New    Target Date 12/19/20               PT Long Term Goals - 11/27/20 1257       PT LONG TERM GOAL #1   Title Pt will acheive at least 145 degrees flexion, 120 degrees abduction, 80 degrees ER and IR PROM to promote ability to reach when appropriate.    Baseline PROM R shoulder 105 degrees flexion, 45 degrees abduction, 40 degrees ER (11/27/2020)    Time 12    Period Weeks    Status New    Target Date 02/20/21      PT LONG TERM GOAL #2   Title Pt will acheive at least 140 degrees flexion, 115 degrees abduction, 80 degrees ER and IR AROM to promote ability to reach when appropriate.    Baseline PROM R shoulder 105 degrees flexion, 45 degrees abduction, 40 degrees ER  (11/27/2020)    Time 12    Period Weeks    Status New    Target Date 02/20/21      PT LONG TERM GOAL #3   Title Pt will have  at least 4/5 R shoulder flexion, abduction, ER, IR strength to promote ability to use her R UE for tasks when appropriate.    Baseline R shoulder strength not tested yet to promote more healing (11/27/2020)    Time 12    Period Weeks    Status New    Target Date 02/20/21      PT LONG TERM GOAL #4   Title Pt will have a decrease in L shoulder and UE pain to 5/10 or less at worst to promote ability to reach, carry, perform functional tasks with her L UE with less difficulty.    Baseline 10/10 L shoulder and UE at worst (11/25/2020)    Time 12    Period Weeks    Status New    Target Date 02/20/21      PT LONG TERM GOAL #5   Title Pt will improve her FOTO score by at least 10 points as a demonstration of improved function.    Baseline Shoulder FOTO 34 (11/25/2020)    Time 12    Period Weeks    Status New    Target Date 02/20/21                   Plan - 12/04/20 0940     Clinical Impression Statement Pt currently about 10 weeks post op. Continued working on PROM (secondary to late start to PT due to needing more time for tissue healing) to decrease stiffness. Cues needed to decrease muscle guarding and shoulder shrug. Pt demonstrates sensitivity to pain. Fair tolerance to today's session. Pt will benefit from continued skilled physical therapy services to decrease pain, improve strength and function.    Personal Factors and Comorbidities Comorbidity 3+;Education;Finances;Fitness;Past/Current Experience;Social Background;Time since onset of injury/illness/exacerbation    Comorbidities Axiety, bipolar disorder, cocaine abuse, depression, hx of borderline personality disorder, hx of R rotator cuff surgery 2019, HTN    Examination-Activity Limitations Bathing;Lift;Toileting;Reach Overhead;Self Feeding;Carry;Hygiene/Grooming    Stability/Clinical Decision Making  Stable/Uncomplicated    Rehab Potential Fair    PT Frequency 2x / week    PT Duration 12 weeks    PT Treatment/Interventions Therapeutic activities;Therapeutic exercise;Neuromuscular re-education;Patient/family education;Manual techniques;Dry needling;Aquatic Therapy;Electrical Stimulation;Iontophoresis 4mg /ml Dexamethasone    PT Next Visit Plan Follow surgeon's protocol    PT Home Exercise Plan Medbridge Access Code [email or web address]    Consulted and Agree with Plan of Care Patient             Patient will benefit from skilled therapeutic intervention in order to improve the following deficits and impairments:  Pain, Improper body mechanics, Postural dysfunction, Impaired UE functional use, Decreased strength, Decreased range of motion  Visit Diagnosis: Chronic right shoulder pain  Muscle weakness (generalized)     Problem List Patient Active Problem List   Diagnosis Date Noted   Depression, recurrent (HCC) 11/01/2019   Encounter for screening mammogram for malignant neoplasm of breast 11/01/2019   Stress incontinence of urine 10/11/2019   Encounter for general adult medical examination with abnormal findings 06/28/2019   Chronic otitis externa of both ears 06/28/2019   Irritable bowel syndrome with both constipation and diarrhea 06/28/2019   Exposure to STD 06/28/2019   Routine cervical smear 06/28/2019   Dysuria 06/28/2019   Illicit drug use 06/28/2019   Hyperlipidemia 01/19/2019   Depression, major, single episode, moderate (HCC) 01/19/2019   Cocaine dependence without complication (HCC) 01/19/2019   Chronic low back pain without sciatica 07/25/2017   Acute vaginitis 05/05/2017   Candidiasis of  vulva and vagina 05/05/2017   Acute sinusitis, unspecified 05/05/2017   Essential (primary) hypertension 05/05/2017   Body mass index 45.0-49.9, adult (HCC) 05/05/2017   Status post rotator cuff repair 07/17/2014   Complete tear of right rotator cuff 01/04/2014    Loralyn Freshwater PT,  DPT  12/04/2020, 9:42 AM  [facility] [address] [place], [address] Phone: [phone]   Fax:  [phone]  Name: TRENDA CORLISS MRN: [id number] Date of Birth: Oct 22, 1960

## 2020-12-11 ENCOUNTER — Ambulatory Visit: Payer: Medicaid Other

## 2020-12-11 DIAGNOSIS — G8929 Other chronic pain: Secondary | ICD-10-CM

## 2020-12-11 DIAGNOSIS — M6281 Muscle weakness (generalized): Secondary | ICD-10-CM

## 2020-12-11 DIAGNOSIS — M25511 Pain in right shoulder: Secondary | ICD-10-CM | POA: Diagnosis not present

## 2020-12-11 NOTE — Therapy (Signed)
[facility] [address] [place], [place], [address] Phone: [phone]   Fax:  [phone]  Physical Therapy Treatment  Patient Details  Name: Christina Richards MRN: [id number] Date of Birth: Nov 19, 1960 Referring Provider (PT): Ramond Marrow, MD   Encounter Date: 12/11/2020   PT End of Session - 12/11/20 0858     Visit Number 3    Number of Visits 25    Date for PT Re-Evaluation 02/20/21    PT Start Time 0849    PT Stop Time 0922    PT Time Calculation (min) 33 min    Activity Tolerance Patient limited by pain    Behavior During Therapy [facility] for tasks assessed/performed             Past Medical History:  Diagnosis Date   Allergy    Environmental   Anemia    Anxiety    Bipolar disorder (HCC)    Cocaine abuse (HCC)    smokes crack   Coronary artery disease    Depression    GERD (gastroesophageal reflux disease)    History of borderline personality disorder    History of rotator cuff surgery 05/09/2017   right shoulder   Hypercholesteremia    Hypertension    Sleep apnea    does not use CPAP    Past Surgical History:  Procedure Laterality Date   ECTOPIC PREGNANCY SURGERY     HAND SURGERY Right    thumb fusion   RESECTION DISTAL CLAVICAL  09/26/2020   Procedure: ARTHROSCOPIC RESECTION DISTAL CLAVICAL;  Surgeon: Bjorn Pippin, MD;  Location: [facility];  Service: Orthopedics;;   ROTATOR CUFF REPAIR     SHOULDER ARTHROSCOPY WITH ROTATOR CUFF REPAIR AND SUBACROMIAL DECOMPRESSION Right 09/26/2020   Procedure: SHOULDER ARTHROSCOPY DEBRIDEMENT WITH SUBACROMIAL DECOMPRESSION AND DISTAL CLAVICLE EXCISION AND ARTHROSCOPIC REVISION ROTATOR CUFF REPAIR;  Surgeon: Bjorn Pippin, MD;  Location: [facility];  Service: Orthopedics;  Laterality: Right;    There were no vitals filed for this visit.   Subjective Assessment - 12/11/20 0851     Subjective R shoulder pain recorded at 10/10 NPS.  Does not take pain meds. Still using R arm although she knows she is not supposed to. Pt reports sleeping poorly and is in more pain b/t that and finding out a member of her church passing away.    Pertinent History S/P R rotator cuff repair on 09/26/2020 and L shoulder pain. Pt also having L shoulder pain secondary to using it more for R surgery healing. Pt stopped wearing her sling November 06, 2020 due to the velcro causing sores on her skin. MD office inoformed and they said that it is ok. Feels like R upper trap is pulling with R anterior shoulder discomfort. MD office informed as well. Has not yet had PT since surgery. The only things she has been doing is washing dishes, reaching, and lifting a clothes bag. Pt writes wiht R hand but is ambidextrous. Pt uses her R UE because she lives by herself and does not have anyone to help her.    Patient Stated Goals Be able to get back to be able to lift where its not hurting her so bad at some level. No pain if possible.    Currently in Pain? Yes    Pain Score 10-Worst pain ever    Pain Location Shoulder    Pain Orientation Right    Pain Type Chronic  pain    Pain Onset More than a month ago            BP: 166/96 mm Hg HR: 66 BPM   There.ex:   Pendulums              Flexion/extension 2x1 min  CW 2x1 min   Seated table slides R UE PROM             Flexion 10x3             Scaption 10x3   Seated with R arm propped on table into about 45 degrees flexion              B scapular retraction 10x3   Seated R shoulder ER PROM with arm at side 10x3    Pt educated on flexing/extending R elbow/wrist and hand seated with R shoulder relaxed and not moving in order to maintain join mobility and flexibility in unaffected joints of RUE. Pt verbalized understanding.    Pt requesting to end session early d/t pain from lack of sleep and stress from church member she knew passing away.   PT Education - 12/11/20 2458     Education Details form/technique with  there.ex    Person(s) Educated Patient    Methods Explanation;Demonstration    Comprehension Verbalized understanding;Returned demonstration              PT Short Term Goals - 11/27/20 1255       PT SHORT TERM GOAL #1   Title Pt will be independent with her initial HEP to improve ROM, strength and function.    Baseline Pt has started her HEP. (11/27/2020)    Time 3    Period Weeks    Status New    Target Date 12/19/20               PT Long Term Goals - 11/27/20 1257       PT LONG TERM GOAL #1   Title Pt will acheive at least 145 degrees flexion, 120 degrees abduction, 80 degrees ER and IR PROM to promote ability to reach when appropriate.    Baseline PROM R shoulder 105 degrees flexion, 45 degrees abduction, 40 degrees ER (11/27/2020)    Time 12    Period Weeks    Status New    Target Date 02/20/21      PT LONG TERM GOAL #2   Title Pt will acheive at least 140 degrees flexion, 115 degrees abduction, 80 degrees ER and IR AROM to promote ability to reach when appropriate.    Baseline PROM R shoulder 105 degrees flexion, 45 degrees abduction, 40 degrees ER (11/27/2020)    Time 12    Period Weeks    Status New    Target Date 02/20/21      PT LONG TERM GOAL #3   Title Pt will have at least 4/5 R shoulder flexion, abduction, ER, IR strength to promote ability to use her R UE for tasks when appropriate.    Baseline R shoulder strength not tested yet to promote more healing (11/27/2020)    Time 12    Period Weeks    Status New    Target Date 02/20/21      PT LONG TERM GOAL #4   Title Pt will have a decrease in L shoulder and UE pain to 5/10 or less at worst to promote ability to reach, carry, perform functional tasks with her L UE with less difficulty.  Baseline 10/10 L shoulder and UE at worst (11/25/2020)    Time 12    Period Weeks    Status New    Target Date 02/20/21      PT LONG TERM GOAL #5   Title Pt will improve her FOTO score by at least 10 points as a  demonstration of improved function.    Baseline Shoulder FOTO 34 (11/25/2020)    Time 12    Period Weeks    Status New    Target Date 02/20/21                   Plan - 12/11/20 0859     Clinical Impression Statement Following protocol and primary PT POC with PROM of R shoulder to within limits per surgeon. BP remains elevated but not within contraindicated to treat pt. Pt remains using RUE at home due to home circumstances despite knowing not to per protocol. Overall pt tolerated session with pain levels that are normal to her from previous sessions and demo's good form/technique with pendulums. Pt requesting to end session early due to her pain from lack of sleep. PT will continue to benefit from skilled PT services to decrease pain and improve strength and function.    Personal Factors and Comorbidities Comorbidity 3+;Education;Finances;Fitness;Past/Current Experience;Social Background;Time since onset of injury/illness/exacerbation    Comorbidities Axiety, bipolar disorder, cocaine abuse, depression, hx of borderline personality disorder, hx of R rotator cuff surgery 2019, HTN    Examination-Activity Limitations Bathing;Lift;Toileting;Reach Overhead;Self Feeding;Carry;Hygiene/Grooming    Stability/Clinical Decision Making Stable/Uncomplicated    Clinical Decision Making Low    Rehab Potential Fair    PT Frequency 2x / week    PT Duration 12 weeks    PT Treatment/Interventions Therapeutic activities;Therapeutic exercise;Neuromuscular re-education;Patient/family education;Manual techniques;Dry needling;Aquatic Therapy;Electrical Stimulation;Iontophoresis 4mg /ml Dexamethasone    PT Next Visit Plan Follow surgeon's protocol    PT Home Exercise Plan Medbridge Access Code [email or web address]    Consulted and Agree with Plan of Care Patient             Patient will benefit from skilled therapeutic intervention in order to improve the following deficits and impairments:  Pain, Improper body  mechanics, Postural dysfunction, Impaired UE functional use, Decreased strength, Decreased range of motion  Visit Diagnosis: Chronic right shoulder pain  Muscle weakness (generalized)     Problem List Patient Active Problem List   Diagnosis Date Noted   Depression, recurrent (HCC) 11/01/2019   Encounter for screening mammogram for malignant neoplasm of breast 11/01/2019   Stress incontinence of urine 10/11/2019   Encounter for general adult medical examination with abnormal findings 06/28/2019   Chronic otitis externa of both ears 06/28/2019   Irritable bowel syndrome with both constipation and diarrhea 06/28/2019   Exposure to STD 06/28/2019   Routine cervical smear 06/28/2019   Dysuria 06/28/2019   Illicit drug use 06/28/2019   Hyperlipidemia 01/19/2019   Depression, major, single episode, moderate (HCC) 01/19/2019   Cocaine dependence without complication (HCC) 01/19/2019   Chronic low back pain without sciatica 07/25/2017   Acute vaginitis 05/05/2017   Candidiasis of vulva and vagina 05/05/2017   Acute sinusitis, unspecified 05/05/2017   Essential (primary) hypertension 05/05/2017   Body mass index 45.0-49.9, adult (HCC) 05/05/2017   Status post rotator cuff repair 07/17/2014   Complete tear of right rotator cuff 01/04/2014    Delphia GratesMilton M. Fairly IV, PT, DPT Physical Therapist- [place]  [facility]  12/11/2020, 9:26 AM  [facility] [address], [place], [address] Phone: [phone]   Fax:  [phone]  Name: Christina Richards MRN: [id number] Date of Birth: 1961/01/12

## 2020-12-16 ENCOUNTER — Ambulatory Visit: Payer: Medicaid Other

## 2020-12-18 ENCOUNTER — Ambulatory Visit: Payer: Medicaid Other

## 2020-12-24 ENCOUNTER — Ambulatory Visit: Payer: Medicaid Other | Attending: Sports Medicine

## 2020-12-31 ENCOUNTER — Ambulatory Visit: Payer: Medicaid Other

## 2020-12-31 ENCOUNTER — Telehealth: Payer: Self-pay

## 2020-12-31 NOTE — Telephone Encounter (Signed)
No show, called pt home phone number. Pt states she forgot to call. Going to see her surgeon again because her shoulder is still hurting. Will call back to reschedule. Pt was informed that we well remove her appointments for her. Pt acknowledged.

## 2021-01-23 ENCOUNTER — Other Ambulatory Visit: Payer: Self-pay | Admitting: Nurse Practitioner

## 2021-01-23 DIAGNOSIS — Z1231 Encounter for screening mammogram for malignant neoplasm of breast: Secondary | ICD-10-CM

## 2021-02-04 ENCOUNTER — Other Ambulatory Visit: Payer: Self-pay | Admitting: Orthopaedic Surgery

## 2021-02-04 DIAGNOSIS — M5412 Radiculopathy, cervical region: Secondary | ICD-10-CM

## 2021-02-18 ENCOUNTER — Other Ambulatory Visit: Payer: Medicaid Other

## 2021-02-19 ENCOUNTER — Encounter: Payer: Self-pay | Admitting: Physical Medicine and Rehabilitation

## 2021-03-10 ENCOUNTER — Ambulatory Visit: Payer: Medicaid Other

## 2021-05-14 ENCOUNTER — Other Ambulatory Visit: Payer: Self-pay | Admitting: Family Medicine

## 2021-05-14 ENCOUNTER — Ambulatory Visit
Admission: RE | Admit: 2021-05-14 | Discharge: 2021-05-14 | Disposition: A | Discharge status: Home / Self Care | Payer: Medicaid Other | Attending: Family Medicine | Admitting: Family Medicine

## 2021-05-14 ENCOUNTER — Ambulatory Visit
Admission: RE | Admit: 2021-05-14 | Discharge: 2021-05-14 | Disposition: A | Discharge status: Home / Self Care | Payer: Medicaid Other | Source: Ambulatory Visit | Attending: Family Medicine | Admitting: Family Medicine

## 2021-05-14 DIAGNOSIS — R059 Cough, unspecified: Secondary | ICD-10-CM

## 2021-05-19 ENCOUNTER — Encounter
Payer: Medicaid Other | Attending: Physical Medicine and Rehabilitation | Admitting: Physical Medicine and Rehabilitation

## 2021-05-19 ENCOUNTER — Other Ambulatory Visit: Payer: Self-pay

## 2021-05-19 ENCOUNTER — Encounter: Payer: Self-pay | Admitting: Physical Medicine and Rehabilitation

## 2021-05-19 VITALS — BP 142/90 | HR 75 | Ht 64.0 in | Wt 295.0 lb

## 2021-05-19 DIAGNOSIS — M2550 Pain in unspecified joint: Secondary | ICD-10-CM | POA: Diagnosis not present

## 2021-05-19 DIAGNOSIS — M353 Polymyalgia rheumatica: Secondary | ICD-10-CM | POA: Diagnosis not present

## 2021-05-19 MED ORDER — GABAPENTIN 300 MG PO CAPS: 300.0000 mg | ORAL_CAPSULE | Freq: Every day | ORAL | 5 refills | Status: DC

## 2021-05-19 NOTE — Patient Instructions (Addendum)
Pt is a 61 yr old female with hx of morbid obesity- BMI 50.5, HTN, as well as BPD, and Polymyalgia rheumatica?- as well as cervical radiculopathy and R shoulder pain - had RTC injury- x 2 surgery- and neck pain.  B/L knees- R hurts most-   Will place referral to Rheumatology to see about Polymyalgia rheumatica- d/w who had never heard of dx.   2.   Needs to be checked for diabetes by PCP- needs to get HbA1c- gets thirsty "a lot". "And has bubbles in pee"   3.  Gabapentin 300 mg nightly x 3 days; then 300 mg 3x/day x 3 days, then 600 mg 2x/day- for chronic pain-   4. Needs to get moving- at least around the home- staying still makes pain WORSE! And makes weakness and balance worse- so needs to walk, no matter what.  Try the pedal bike that [employer] sit in front of couch and get moving.    5. Wait for additional med treatment until see Rheumatology and see what diagnosis is- and treat as required.    6.  F/U in 2-3 months- and see how things going- call me if any side effects.
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
## 2021-05-27 ENCOUNTER — Ambulatory Visit: Payer: Medicaid Other | Admitting: Cardiovascular Disease

## 2021-06-06 ENCOUNTER — Ambulatory Visit (INDEPENDENT_AMBULATORY_CARE_PROVIDER_SITE_OTHER): Payer: Medicaid Other | Admitting: Cardiology

## 2021-06-06 ENCOUNTER — Other Ambulatory Visit: Payer: Self-pay

## 2021-06-06 ENCOUNTER — Encounter: Payer: Self-pay | Admitting: Cardiology

## 2021-06-06 VITALS — BP 130/80 | HR 96 | Ht 64.0 in | Wt 299.0 lb

## 2021-06-06 DIAGNOSIS — I499 Cardiac arrhythmia, unspecified: Secondary | ICD-10-CM | POA: Diagnosis not present

## 2021-06-06 DIAGNOSIS — F17200 Nicotine dependence, unspecified, uncomplicated: Secondary | ICD-10-CM

## 2021-06-06 DIAGNOSIS — I10 Essential (primary) hypertension: Secondary | ICD-10-CM

## 2021-06-06 NOTE — Patient Instructions (Signed)

## 2021-06-06 NOTE — Progress Notes (Signed)
Cardiology Office Note:    Date:  06/06/2021   ID:  Christina Richards, DOB 1961/01/18, MRN [id number]  PCP:  [facility] [place] [facility] Cardiologist:  Kate Sable, MD     Referring MD: [facility] [place]   Chief Complaint  Patient presents with   New Patient (Initial Visit)    Referred by PCP for irregular Heart Rhythm. Meds reviewed verbally with patient.     History of Present Illness:    Christina Richards is a 61 y.o. female with a hx of hypertension, hyperlipidemia, current smoker, obesity who presents due to irregular heartbeat.  Patient saw her primary care provider for a regular visit, she was told her heartbeat was irregular, advised to follow-up with cardiology.  She denies palpitations, dizziness, syncope.  She currently smokes, is working on quitting.  Recently started on Ozempic for weight loss.  Denies any history of heart disease, states feeling okay.  Past Medical History:  Diagnosis Date   Allergy    Environmental   Anemia    Anxiety    Bipolar disorder ([place])    Cocaine abuse ([facility])    smokes crack   Coronary artery disease    Depression    GERD (gastroesophageal reflux disease)    History of borderline personality disorder    History of rotator cuff surgery 05/09/2017   right shoulder   Hypercholesteremia    Hypertension    Sleep apnea    does not use CPAP    Past Surgical History:  Procedure Laterality Date   ECTOPIC PREGNANCY SURGERY     HAND SURGERY Right    thumb fusion   RESECTION DISTAL CLAVICAL  09/26/2020   Procedure: ARTHROSCOPIC RESECTION DISTAL CLAVICAL;  Surgeon: Hiram Gash, MD;  Location: [place];  Service: Orthopedics;;   ROTATOR CUFF REPAIR     SHOULDER ARTHROSCOPY WITH ROTATOR CUFF REPAIR AND SUBACROMIAL DECOMPRESSION Right 09/26/2020   Procedure: SHOULDER ARTHROSCOPY DEBRIDEMENT WITH SUBACROMIAL DECOMPRESSION AND DISTAL CLAVICLE EXCISION AND ARTHROSCOPIC REVISION ROTATOR CUFF  REPAIR;  Surgeon: Hiram Gash, MD;  Location: [place];  Service: Orthopedics;  Laterality: Right;    Current Medications: Current Meds  Medication Sig   acetaminophen (TYLENOL) 500 MG tablet Take by mouth.   atorvastatin (LIPITOR) 20 MG tablet Take 20 mg by mouth at bedtime.   cetirizine (ZYRTEC) 10 MG tablet Take 10 mg by mouth daily as needed.   diazepam (VALIUM) 5 MG tablet Take 1 tab (5mg ) 45 minutes before MRI.  If still anxious, take 2nd tab (5mg ) 15 minutes before.  Tell MRI staff.  Have a driver.   diclofenac (VOLTAREN) 75 MG EC tablet Take 75 mg by mouth 2 (two) times daily as needed.   diclofenac Sodium (VOLTAREN) 1 % GEL Apply topically 4 (four) times daily.   diltiazem (TIAZAC) 360 MG 24 hr capsule Take 1 capsule (360 mg total) by mouth daily.   FLOVENT HFA 110 MCG/ACT inhaler Inhale 2 puffs into the lungs 2 (two) times daily.   fluticasone (FLONASE) 50 MCG/ACT nasal spray 2 sprays into each nostril two (2) times a day.   gabapentin (NEURONTIN) 300 MG capsule Take 1 capsule (300 mg total) by mouth at bedtime. Take QHS x 3 days; then 300 mg 3x/day x 3 days, then 600 mg 2x/day- for chronic pain   Hypertonic Nasal Wash (NASAFLO NETI POT NASAL WASH) PACK 3 each into each nostril in the morning and 3  each in the evening. Irrigate with one bottle twice daily.   loratadine (CLARITIN) 10 MG tablet Take 10 mg by mouth daily.   methocarbamol (ROBAXIN) 500 MG tablet Take 1 tablet (500 mg total) by mouth every 8 (eight) hours as needed for muscle spasms.   methocarbamol (ROBAXIN) 750 MG tablet Take 750 mg by mouth every 8 (eight) hours as needed.   omeprazole (PRILOSEC OTC) 20 MG tablet Take 1 tablet by mouth daily.   OZEMPIC, 0.25 OR 0.5 MG/DOSE, 2 MG/1.5ML SOPN Inject into the skin.   PATADAY 0.7 % SOLN Apply 1 drop to eye daily.   predniSONE (DELTASONE) 10 MG tablet Take 40 mg by mouth daily.   promethazine (PHENERGAN) 25 MG tablet Take 1 tablet (25 mg total) by mouth  every 6 (six) hours as needed for nausea or vomiting.   PROVENTIL HFA 108 (90 Base) MCG/ACT inhaler Inhale 2 puffs into the lungs every 4 (four) hours as needed.     Allergies:   Ibuprofen   Social History   Socioeconomic History   Marital status: Single    Spouse name: Not on file   Number of children: 1   Years of education: Not on file   Highest education level: 10th grade  Occupational History   Not on file  Tobacco Use   Smoking status: Every Day    Packs/day: 0.25    Types: Cigarettes   Smokeless tobacco: Never   Tobacco comments:    Smoked this am  Vaping Use   Vaping Use: Never used  Substance and Sexual Activity   Alcohol use: No   Drug use: Not Currently    Types: "Crack" cocaine    Comment: 09/18/2020   Sexual activity: Never    Birth control/protection: Post-menopausal  Other Topics Concern   Not on file  Social History Narrative   Not on file   Social Determinants of Health   Financial Resource Strain: Not on file  Food Insecurity: Not on file  Transportation Needs: Not on file  Physical Activity: Not on file  Stress: Not on file  Social Connections: Not on file     Family History: The patient's Family history is unknown by patient.  ROS:   Please see the history of present illness.     All other systems reviewed and are negative.  EKGs/Labs/Other Studies Reviewed:    The following studies were reviewed today:   EKG:  EKG is  ordered today.  The ekg ordered today demonstrates sinus rhythm, marked sinus arrhythmia, PACs.  Recent Labs: No results found for requested labs within last 8760 hours.  Recent Lipid Panel    Component Value Date/Time   CHOL 230 (H) 12/06/2018 1134   TRIG 140 12/06/2018 1134   HDL 67 12/06/2018 1134   LDLCALC 135 (H) 12/06/2018 1134     Risk Assessment/Calculations:          Physical Exam:    VS:  BP 130/80 (BP Location: Left Arm, Patient Position: Sitting, Cuff Size: Large)    Pulse 96    Ht 5\' 4"   (1.626 m)    Wt 299 lb (135.6 kg)    LMP 12/08/2012 Comment: no periods since" I was 39"   BMI 51.32 kg/m     Wt Readings from Last 3 Encounters:  06/06/21 299 lb (135.6 kg)  05/19/21 295 lb (133.8 kg)  09/26/20 267 lb 3.2 oz (121.2 kg)     GEN:  Well nourished, well developed in no acute  distress HEENT: Normal NECK: No JVD; No carotid bruits CARDIAC: Irregular rhythm, no murmurs RESPIRATORY:  Clear to auscultation without rales, wheezing or rhonchi  ABDOMEN: Soft, non-tender, non-distended MUSCULOSKELETAL:  No edema; No deformity  SKIN: Warm and dry NEUROLOGIC:  Alert and oriented x 3 PSYCHIATRIC:  Normal affect   ASSESSMENT:    1. Irregular heart beats   2. Primary hypertension   3. Obesity, morbid ([place])   4. Smoking    PLAN:    In order of problems listed above:  Irregular heartbeat, EKG showing marked sinus arrhythmia, PACs likely contributing to irregular heartbeats.  Smoking cessation, weight loss advised. Hypertension, continue diltiazem. Morbid obesity, weight loss, low-calorie diet advised. Current smoker, cessation advised.  Follow-up as needed       Medication Adjustments/Labs and Tests Ordered: Current medicines are reviewed at length with the patient today.  Concerns regarding medicines are outlined above.  Orders Placed This Encounter  Procedures   EKG 12-Lead   No orders of the defined types were placed in this encounter.   Patient Instructions  Medication Instructions:  Your physician recommends that you continue on your current medications as directed. Please refer to the Current Medication list given to you today.  *If you need a refill on your cardiac medications before your next appointment, please call your pharmacy*   Lab Work: None ordered If you have labs (blood work) drawn today and your tests are completely normal, you will receive your results only by: [place] (if you have MyChart) OR A paper copy in the mail If you have  any lab test that is abnormal or we need to change your treatment, we will call you to review the results.   Testing/Procedures: None ordered   Follow-Up: At [facility], you and your health needs are our priority.  As part of our continuing mission to provide you with exceptional heart care, we have created designated Provider Care Teams.  These Care Teams include your primary Cardiologist (physician) and Advanced Practice Providers (APPs -  Physician Assistants and Nurse Practitioners) who all work together to provide you with the care you need, when you need it.  We recommend signing up for the patient portal called "MyChart".  Sign up information is provided on this After Visit Summary.  MyChart is used to connect with patients for Virtual Visits (Telemedicine).  Patients are able to view lab/test results, encounter notes, upcoming appointments, etc.  Non-urgent messages can be sent to your provider as well.   To learn more about what you can do with MyChart, go to [email or web address].    Your next appointment:   Follow up as needed   The format for your next appointment:   In Person  Provider:   You may see Kate Sable, MD or one of the following Advanced Practice Providers on your designated Care Team:   Murray Hodgkins, NP Christell Faith, PA-C Cadence Kathlen Mody, [place]    Other Instructions     Signed, Kate Sable, MD  06/06/2021 4:59 PM    [place]

## 2021-06-20 ENCOUNTER — Ambulatory Visit: Payer: Medicaid Other | Admitting: Internal Medicine

## 2021-06-30 ENCOUNTER — Other Ambulatory Visit: Payer: Self-pay

## 2021-06-30 ENCOUNTER — Encounter: Payer: Self-pay | Admitting: Gastroenterology

## 2021-06-30 ENCOUNTER — Ambulatory Visit (INDEPENDENT_AMBULATORY_CARE_PROVIDER_SITE_OTHER): Payer: Medicaid Other | Admitting: Gastroenterology

## 2021-06-30 VITALS — BP 140/94 | HR 71 | Temp 98.4°F | Ht 64.0 in | Wt 292.0 lb

## 2021-06-30 DIAGNOSIS — R194 Change in bowel habit: Secondary | ICD-10-CM

## 2021-06-30 DIAGNOSIS — K219 Gastro-esophageal reflux disease without esophagitis: Secondary | ICD-10-CM | POA: Diagnosis not present

## 2021-06-30 DIAGNOSIS — R1319 Other dysphagia: Secondary | ICD-10-CM | POA: Diagnosis not present

## 2021-06-30 MED ORDER — NA SULFATE-K SULFATE-MG SULF 17.5-3.13-1.6 GM/177ML PO SOLN: 1.0000 | Freq: Once | ORAL | 0 refills | Status: AC

## 2021-06-30 NOTE — Progress Notes (Signed)
? ? ?Gastroenterology Consultation ? ?Referring Provider:     [facility] ?[facility] Physician[facility]  [facility] ?Primary Gastroenterologist:  Dr. Servando SnareWohl     ?Reason for Consultation:     Multiple GI issues ?      ? HPI:   ?Christina Richards is a 61 y.o. y/o female referred for consultation & management of Multiple GI issues by Dr. Eli Phillipsenter, [facility].  This patient comes in today with a history of a mother with colon cancer and a history of GERD.  The patient does not know the age of her mother when she had colon cancer.  She does report that she has dysphasia with the feeling of slime in the back of her throat after she eats.  She also states that she has to spit a lot after eating.  She thinks that this is due to a funny T she has in her mouth.  She also reports that she takes senna because of a long history of constipation. The patient states that she was supposed to have a colonoscopy in the past but due to issues with drug use it had to be canceled in the past.  ? ? ?Past Medical History:  ?Diagnosis Date  ? Allergy   ? Environmental  ? Anemia   ? Anxiety   ? Bipolar disorder (H[facility])   ? Cocaine abuse ([facility])   ? smokes crack  ? Coronary artery disease   ? Depression   ? GERD (gastroesophageal reflux disease)   ? History of borderline personality disorder   ? History of rotator cuff surgery 05/09/2017  ? right shoulder  ? Hypercholesteremia   ? Hypertension   ? Sleep apnea   ? does not use CPAP  ? ? ?Past Surgical History:  ?Procedure Laterality Date  ? ECTOPIC PREGNANCY SURGERY    ? HAND SURGERY Right   ? thumb fusion  ? RESECTION DISTAL CLAVICAL  09/26/2020  ? Procedure: ARTHROSCOPIC RESECTION DISTAL CLAVICAL;  Surgeon: Bjorn PippinVarkey, Dax T, MD;  Location: [facility];  Service: Orthopedics;;  ? ROTATOR CUFF REPAIR    ? SHOULDER ARTHROSCOPY WITH ROTATOR CUFF REPAIR AND SUBACROMIAL DECOMPRESSION Right 09/26/2020  ? Procedure: SHOULDER ARTHROSCOPY DEBRIDEMENT  WITH SUBACROMIAL DECOMPRESSION AND DISTAL CLAVICLE EXCISION AND ARTHROSCOPIC REVISION ROTATOR CUFF REPAIR;  Surgeon: Bjorn PippinVarkey, Dax T, MD;  Location: [facility];  Service: Orthopedics;  Laterality: Right;  ? ? ?Prior to Admission medications   ?Medication Sig Start Date End Date Taking? Authorizing Provider  ?acetaminophen (TYLENOL) 500 MG tablet Take by mouth.   Yes [provider]  ?atorvastatin (LIPITOR) 20 MG tablet Take 20 mg by mouth at bedtime. 01/28/21  Yes [provider]  ?diclofenac (VOLTAREN) 75 MG EC tablet Take 75 mg by mouth 2 (two) times daily as needed. 11/20/20  Yes [provider]  ?diltiazem (TIAZAC) 360 MG 24 hr capsule Take 1 capsule (360 mg total) by mouth daily. 05/26/19  Yes [place], Coralee NorthAdam J, NP  ?FLOVENT HFA 110 MCG/ACT inhaler Inhale 2 puffs into the lungs 2 (two) times daily. 05/07/21  Yes [provider]  ?fluticasone (FLONASE) 50 MCG/ACT nasal spray 2 sprays into each nostril two (2) times a day. 03/19/21 03/19/22 Yes [provider]  ?gabapentin (NEURONTIN) 300 MG capsule Take 1 capsule (300 mg total) by mouth at bedtime. Take QHS x 3 days; then 300 mg 3x/day x 3 days, then 600 mg 2x/day- for chronic pain 05/19/21  Yes Lovorn, Megan, MD  ?Hypertonic Nasal Wash (NASAFLO NETI POT NASAL WASH) PACK 3 each into each nostril in the morning and 3 each in the evening. Irrigate with one bottle twice daily. 03/19/21  Yes [provider]  ?loratadine (CLARITIN) 10 MG tablet Take 10 mg by mouth daily.   Yes [provider]  ?omeprazole (PRILOSEC OTC) 20 MG tablet Take 1 tablet by mouth daily. 03/19/21 03/19/22 Yes [provider]  ?OZEMPIC, 0.25 OR 0.5 MG/DOSE, 2 MG/1.5ML SOPN Inject into the skin. 05/08/21  Yes [provider]  ?PROVENTIL HFA 108 (90 Base) MCG/ACT inhaler Inhale 2 puffs into the lungs every 4 (four) hours as needed. 02/18/21  Yes [provider]  ? ? ?Family History  ?Family history  unknown: Yes  ?  ? ?Social History  ? ?Tobacco Use  ? Smoking status: Every Day  ?  Packs/day: 0.25  ?  Types: Cigarettes  ? Smokeless tobacco: Never  ? Tobacco comments:  ?  Smoked this am  ?Vaping Use  ? Vaping Use: Never used  ?Substance Use Topics  ? Alcohol use: No  ? Drug use: Not Currently  ?  Types: "Crack" cocaine  ?  Comment: 09/18/2020  ? ? ?Allergies as of 06/30/2021 - Review Complete 06/30/2021  ?Allergen Reaction Noted  ? Ibuprofen Nausea Only 02/15/2015  ? ? ?Review of Systems:    ?All systems reviewed and negative except where noted in HPI. ? ? Physical Exam:  ?BP (!) 140/94   Pulse 71   Temp 98.4 ?F (36.9 ?C) (Oral)   Ht 5\' 4"  (1.626 m)   Wt 292 lb (132.5 kg)   LMP 12/08/2012 Comment: no periods since" I was 39"  BMI 50.12 kg/m?  ?Patient's last menstrual period was 12/08/2012. ?General:   Alert,  Well-developed, well-nourished, pleasant and cooperative in NAD ?Head:  Normocephalic and atraumatic. ?Eyes:  Sclera clear, no icterus.   Conjunctiva pink. ?Ears:  Normal auditory acuity. ?Neck:  Supple; no masses or thyromegaly. ?Lungs:  Respirations even and unlabored.  Clear throughout to auscultation.   No wheezes, crackles, or rhonchi. No acute distress. ?Heart:  Regular rate and rhythm; no murmurs, clicks, rubs, or gallops. ?Abdomen:  Normal bowel sounds.  No bruits.  Soft, non-tender and non-distended without masses, hepatosplenomegaly or hernias noted.  No guarding or rebound tenderness.  Negative Carnett sign.   ?Rectal:  Deferred.  ?Pulses:  Normal pulses noted. ?Extremities:  No clubbing or edema.  No cyanosis. ?Neurologic:  Alert and oriented x3;  grossly normal neurologically. ?Skin:  Intact without significant lesions or rashes.  No jaundice. ?Lymph Nodes:  No significant cervical adenopathy. ?Psych:  Alert and cooperative. Normal mood and affect. ? ?Imaging Studies: ?No results found. ? ?Assessment and Plan:  ? ?Christina Richards is a 61 y.o. y/o female Who comes in today with a history of  dysphagia, GERD and change in bowel habits with constipation.  The patient also reports that her mother had colon cancer but is not sure what age she had colon cancer.  The patient has never had a colonoscopy but states that she had a stool test but states she never got the results..  The patient will be set up for a EGD and colonoscopy due to her symptoms of dysphagia and GERD and change in bowel habits.  The patient has been explained the plan and agrees with it. ? ? ? ?77, MD. Midge Minium ? ? ? Note: This dictation was prepared with Clementeen Graham  dictation along with smaller phrase technology. Any transcriptional errors that result from this process are unintentional.   ?

## 2021-08-04 ENCOUNTER — Telehealth: Payer: Self-pay | Admitting: Gastroenterology

## 2021-08-04 ENCOUNTER — Telehealth: Payer: Self-pay

## 2021-08-04 NOTE — Telephone Encounter (Signed)
PT left message to cancel procedure for 08/05/2021 will call back and resched ?

## 2021-08-04 NOTE — Telephone Encounter (Signed)
Received message that patient would like to cancel her Colonoscopy scheduled with Dr. Servando Snare for tomorrow.  Notified Raynelle Fanning in Endo of cancellation. ? ?Thanks, ?Marcelino Duster, CMA ?

## 2021-08-05 ENCOUNTER — Telehealth: Payer: Self-pay | Admitting: Gastroenterology

## 2021-08-05 ENCOUNTER — Ambulatory Visit: Admission: RE | Admit: 2021-08-05 | Payer: Medicaid Other | Source: Home / Self Care | Admitting: Gastroenterology

## 2021-08-05 ENCOUNTER — Encounter: Admission: RE | Payer: Self-pay | Source: Home / Self Care

## 2021-08-05 SURGERY — COLONOSCOPY WITH PROPOFOL
Anesthesia: General

## 2021-08-05 NOTE — Telephone Encounter (Signed)
Pt called and left message to resche colononscopy and indoscopy  ?

## 2021-08-06 ENCOUNTER — Telehealth: Payer: Self-pay

## 2021-08-06 NOTE — Telephone Encounter (Signed)
Returned call to reschedule patients Colonoscopy w/EGD with Dr. Allen Norris.  She said she appreciated the call back but could not talk at this time.  She will call back. ? ?Scheduling Note: EGD Dx: GERD, Colonoscopy Dx: Change in bowel habits ? ?Thanks, ?Sharyn Lull, CMA ?

## 2021-08-17 NOTE — Progress Notes (Signed)
? ?Office Visit Note ? ?Patient: Christina Richards             ?Date of Birth: December 27, 1960           ?MRN: [id number]             ?PCP: [facility] ?Referring: Genice RougeLovorn, Megan, MD ?Visit Date: 08/19/2021 ? ? ?Subjective:  ?New Patient (Initial Visit) (Total body pain) ? ? ?History of Present Illness: Christina SongsterLisa M Sturgeon is a 61 y.o. female here for evaluation of joint pains and mobility problems with history of possible PMR and fibromyalgia syndrome. She has known full thickness right rotator cuff tear after multiple falls diagnosed in 2015.  Around this time is when she started having a lot of increased joint pain stiffness and mobility issues.  She has previous right shoulder rotator cuff repair but no known recent injury.  Initially had more localized joint issues but now pain and stiffness in many areas and limiting her mobility.  She requires a caregiver most often grandchildren staying with her in the home to provide assistance with IADLs and possibly some ADLs.  She had knee injections and low back steroid injections last year which were somewhat beneficial.  However symptoms have not been permanently improved. ?Outside of the musculoskeletal pain issues she is also having trouble with concentration and short-term memory function.  She has persistent constipation with intermittent bright red blood per rectum and taking Metamucil for suspected internal hemorrhoids.  She is not experiencing vertigo but does suddenly lose her balance on several occasions. ?History was somewhat unclear with medical record of PMR but patient denies any familiarity with this diagnosis or recall being told she has this in the past.  She did have some degree of shoulder bursitis clinical picture with responsiveness to steroids. ? ?Activities of Daily Living:  ?Patient reports morning stiffness for 15 minutes.   ?Patient Reports nocturnal pain.  ?Difficulty dressing/grooming: Reports ?Difficulty climbing stairs:  Reports ?Difficulty getting out of chair: Reports ?Difficulty using hands for taps, buttons, cutlery, and/or writing: Reports ? ?Review of Systems  ?Constitutional:  Positive for fatigue.  ?HENT:  Positive for mouth dryness.   ?Eyes:  Negative for dryness.  ?Respiratory:  Positive for shortness of breath.   ?Cardiovascular:  Positive for swelling in legs/feet.  ?Gastrointestinal:  Positive for constipation.  ?Endocrine: Positive for heat intolerance, excessive thirst and increased urination.  ?Genitourinary:  Negative for difficulty urinating.  ?Musculoskeletal:  Positive for joint pain, gait problem, joint pain, muscle weakness, morning stiffness and muscle tenderness.  ?Skin:  Negative for rash.  ?Allergic/Immunologic: Negative for susceptible to infections.  ?Neurological:  Positive for numbness and weakness.  ?Hematological:  Positive for bruising/bleeding tendency.  ?Psychiatric/Behavioral:  Positive for sleep disturbance.   ? ?PMFS History:  ?Patient Active Problem List  ? Diagnosis Date Noted  ? Fibromyalgia syndrome 08/19/2021  ? Bilateral primary osteoarthritis of knee 08/19/2021  ? Obesity, morbid (HCC) 05/19/2021  ? Depression, recurrent (HCC) 11/01/2019  ? Encounter for screening mammogram for malignant neoplasm of breast 11/01/2019  ? Stress incontinence of urine 10/11/2019  ? Encounter for general adult medical examination with abnormal findings 06/28/2019  ? Chronic otitis externa of both ears 06/28/2019  ? Irritable bowel syndrome with both constipation and diarrhea 06/28/2019  ? Exposure to STD 06/28/2019  ? Routine cervical smear 06/28/2019  ? Dysuria 06/28/2019  ? Illicit drug use 06/28/2019  ? Hyperlipidemia 01/19/2019  ? Depression, major, single episode, moderate (HCC) 01/19/2019  ?  Cocaine dependence without complication (HCC) 01/19/2019  ? Chronic low back pain without sciatica 07/25/2017  ? Acute vaginitis 05/05/2017  ? Candidiasis of vulva and vagina 05/05/2017  ? Acute sinusitis,  unspecified 05/05/2017  ? Essential (primary) hypertension 05/05/2017  ? Body mass index 45.0-49.9, adult (HCC) 05/05/2017  ? Status post rotator cuff repair 07/17/2014  ? Complete tear of right rotator cuff 01/04/2014  ?  ?Past Medical History:  ?Diagnosis Date  ? Allergy   ? Environmental  ? Anemia   ? Anxiety   ? Arthritis   ? Bipolar disorder (HCC)   ? Cocaine abuse (HCC)   ? smokes crack  ? Coronary artery disease   ? Depression   ? GERD (gastroesophageal reflux disease)   ? History of borderline personality disorder   ? History of rotator cuff surgery 05/09/2017  ? right shoulder  ? Hypercholesteremia   ? Hypertension   ? Sleep apnea   ? does not use CPAP  ?  ?Family History  ?Family history unknown: Yes  ? ?Past Surgical History:  ?Procedure Laterality Date  ? ECTOPIC PREGNANCY SURGERY    ? HAND SURGERY Right   ? thumb fusion  ? RESECTION DISTAL CLAVICAL  09/26/2020  ? Procedure: ARTHROSCOPIC RESECTION DISTAL CLAVICAL;  Surgeon: Bjorn Pippin, MD;  Location: [facility];  Service: Orthopedics;;  ? ROTATOR CUFF REPAIR    ? SHOULDER ARTHROSCOPY WITH ROTATOR CUFF REPAIR AND SUBACROMIAL DECOMPRESSION Right 09/26/2020  ? Procedure: SHOULDER ARTHROSCOPY DEBRIDEMENT WITH SUBACROMIAL DECOMPRESSION AND DISTAL CLAVICLE EXCISION AND ARTHROSCOPIC REVISION ROTATOR CUFF REPAIR;  Surgeon: Bjorn Pippin, MD;  Location: [facility];  Service: Orthopedics;  Laterality: Right;  ? ?Social History  ? ?Social History Narrative  ? Not on file  ? ?Immunization History  ?Administered Date(s) Administered  ? PFIZER(Purple Top)SARS-COV-2 Vaccination 07/12/2019, 08/02/2019  ?  ? ?Objective: ?Vital Signs: BP 139/85 (BP Location: Right Arm, Patient Position: Sitting, Cuff Size: Normal)   Pulse 80   Resp 20   Ht 5\' 2"  (1.575 m)   Wt (!) 303 lb (137.4 kg)   LMP 12/08/2012 Comment: no periods since" I was 39"  BMI 55.42 kg/m?   ? ?Physical Exam ?Constitutional:   ?   Appearance: She is obese.  ?Eyes:  ?    Conjunctiva/sclera: Conjunctivae normal.  ?Cardiovascular:  ?   Rate and Rhythm: Normal rate and regular rhythm.  ?Pulmonary:  ?   Effort: Pulmonary effort is normal.  ?   Breath sounds: Normal breath sounds.  ?Musculoskeletal:  ?   Right lower leg: No edema.  ?   Left lower leg: No edema.  ?Skin: ?   General: Skin is warm and dry.  ?   Findings: No rash.  ?Neurological:  ?   Mental Status: She is alert.  ?Psychiatric:     ?   Mood and Affect: Mood normal.  ?  ? ?Musculoskeletal Exam:  ?Neck tenderness to pressure on sides and back, decreased active rotation due to pain ?Shoulders unwilling to abduct overhead due to pain, pain more proximal than laterally with internal and external rotation, passive and active, no palpable swelling ?Diffuse tenderness throughout upper and lower arm to pressure ?No palpable synovitis in elbows, wrists, or fingers ?Left lateral hip tenderness to pressure and pain with FABER maneuver ?Knees full ROM mild patellofemoral crepitus present and medial joint line tenderness ?Ankles full ROM no tenderness or swelling ? ? ?Investigation: ?No additional findings. ? ?Imaging: ?No results found. ? ?  Recent Labs: ?Lab Results  ?Component Value Date  ? WBC 3.7 12/06/2018  ? HGB 13.9 12/06/2018  ? PLT 219 12/06/2018  ? NA 141 12/06/2018  ? K 4.1 12/06/2018  ? CL 102 12/06/2018  ? CO2 25 12/06/2018  ? GLUCOSE 102 (H) 12/06/2018  ? BUN 12 12/06/2018  ? CREATININE 1.05 (H) 12/06/2018  ? BILITOT 0.6 12/06/2018  ? ALKPHOS 61 12/06/2018  ? AST 15 12/06/2018  ? ALT 8 12/06/2018  ? PROT 6.8 12/06/2018  ? ALBUMIN 4.0 12/06/2018  ? CALCIUM 9.0 12/06/2018  ? GFRAA 67 12/06/2018  ? ? ?Speciality Comments: No specialty comments available. ? ?Procedures:  ?No procedures performed ?Allergies: Ibuprofen  ? ?Assessment / Plan:     ?Visit Diagnoses: Traumatic complete tear of right rotator cuff, sequela ? ?I think the shoulder pain stiffness and impaired range of motion are more related to chronic rotator cuff  arthropathy not indicative of PMR.  Recent normal inflammatory markers and symptoms doing's at least as well at this time so no point to repeat. ? ?Irritable bowel syndrome with both constipation and diarrhea ? ?Discussed recommendation she

## 2021-08-18 ENCOUNTER — Ambulatory Visit: Payer: Medicaid Other | Admitting: Physical Medicine and Rehabilitation

## 2021-08-19 ENCOUNTER — Encounter: Payer: Self-pay | Admitting: Internal Medicine

## 2021-08-19 ENCOUNTER — Ambulatory Visit: Payer: Medicaid Other | Admitting: Internal Medicine

## 2021-08-19 VITALS — BP 139/85 | HR 80 | Resp 20 | Ht 62.0 in | Wt 303.0 lb

## 2021-08-19 DIAGNOSIS — S46011S Strain of muscle(s) and tendon(s) of the rotator cuff of right shoulder, sequela: Secondary | ICD-10-CM | POA: Diagnosis not present

## 2021-08-19 DIAGNOSIS — M797 Fibromyalgia: Secondary | ICD-10-CM | POA: Diagnosis not present

## 2021-08-19 DIAGNOSIS — M170 Bilateral primary osteoarthritis of knee: Secondary | ICD-10-CM | POA: Diagnosis not present

## 2021-08-19 DIAGNOSIS — K582 Mixed irritable bowel syndrome: Secondary | ICD-10-CM | POA: Diagnosis not present

## 2021-08-19 NOTE — Patient Instructions (Signed)
I recommend checking out the [employer] patient-centered guide for fibromyalgia and chronic pain management: fibroguide.med.[employer].edu   Myofascial Pain Syndrome and Fibromyalgia Myofascial pain syndrome and fibromyalgia are both pain disorders. You may feel this pain mainly in your muscles. Myofascial pain syndrome: Always has tender points in the muscles that will cause pain when pressed (trigger points). The pain may come and go. Usually affects your neck, upper back, and shoulder areas. The pain often moves into your arms and hands. Fibromyalgia: Has muscle pains and tenderness that come and go. Is often associated with tiredness (fatigue) and sleep problems. Has trigger points. Tends to be long-lasting (chronic), but is not life-threatening. Fibromyalgia and myofascial pain syndrome are not the same. However, they often occur together. If you have both conditions, each can make the other worse. Both are common and can cause enough pain and fatigue to make day-to-day activities difficult. Both can be hard to diagnose because their symptoms are common in many other conditions. What are the causes? The exact causes of these conditions are not known. What increases the risk? You are more likely to develop either of these conditions if: You have a family history of the condition. You are female. You have certain triggers, such as: Spine disorders. An injury (trauma) or other physical stressors. Being under a lot of stress. Medical conditions such as osteoarthritis, rheumatoid arthritis, or lupus. What are the signs or symptoms? Fibromyalgia The main symptom of fibromyalgia is widespread pain and tenderness in your muscles. Pain is sometimes described as stabbing, shooting, or burning. You may also have: Tingling or numbness. Sleep problems and fatigue. Problems with attention and concentration (fibro fog). Other symptoms may include: Bowel and bladder  problems. Headaches. Vision problems. Sensitivity to odors and noises. Depression or mood changes. Painful menstrual periods (dysmenorrhea). Dry skin or eyes. These symptoms can vary over time. Myofascial pain syndrome Symptoms of myofascial pain syndrome include: Tight, ropy bands of muscle. Uncomfortable sensations in muscle areas. These may include aching, cramping, burning, numbness, tingling, and weakness. Difficulty moving certain parts of the body freely (poor range of motion). How is this diagnosed? This condition may be diagnosed by your symptoms and medical history. You will also have a physical exam. In general: Fibromyalgia is diagnosed if you have pain, fatigue, and other symptoms for more than 3 months, and symptoms cannot be explained by another condition. Myofascial pain syndrome is diagnosed if you have trigger points in your muscles, and those trigger points are tender and cause pain elsewhere in your body (referred pain). How is this treated? Treatment for these conditions depends on the type that you have. For fibromyalgia a healthy lifestyle is the most important treatment including aerobic and strength exercises. Different types of medicines are used to help treat pain and include: NSAIDs. Medicines for treating depression. Medicines that help control seizures. Medicines that relax the muscles. Treatment for myofascial pain syndrome includes: Pain medicines, such as NSAIDs. Cooling and stretching of muscles. Massage therapy with myofascial release technique. Trigger point injections. Treating these conditions often requires a team of health care providers. These may include: Your primary care provider. A physical therapist. Complementary health care providers, such as massage therapists or acupuncturists. A psychiatrist for cognitive behavioral therapy. Follow these instructions at home: Medicines Take over-the-counter and prescription medicines only as told  by your health care provider. Ask your health care provider if the medicine prescribed to you: Requires you to avoid driving or using machinery. Can cause constipation. You   may need to take these actions to prevent or treat constipation: Drink enough fluid to keep your urine pale yellow. Take over-the-counter or prescription medicines. Eat foods that are high in fiber, such as beans, whole grains, and fresh fruits and vegetables. Limit foods that are high in fat and processed sugars, such as fried or sweet foods. Lifestyle  Do exercises as told by your health care provider or physical therapist. Practice relaxation techniques to control your stress. You may want to try: Biofeedback. Visual imagery. Hypnosis. Muscle relaxation. Yoga. Meditation. Maintain a healthy lifestyle. This includes eating a healthy diet and getting enough sleep. Do not use any products that contain nicotine or tobacco. These products include cigarettes, chewing tobacco, and vaping devices, such as e-cigarettes. If you need help quitting, ask your health care provider. General instructions Talk to your health care provider about complementary treatments, such as acupuncture or massage. Do not do activities that stress or strain your muscles. This includes repetitive motions and heavy lifting. Keep all follow-up visits. This is important. Where to find support Consider joining a support group with others who are diagnosed with this condition. National Fibromyalgia Association: www.fmaware.org Where to find more information American Chronic Pain Association: www.theacpa.org Contact a health care provider if: You have new symptoms. Your symptoms get worse or your pain is severe. You have side effects from your medicines. You have trouble sleeping. Your condition is causing depression or anxiety. Get help right away if: You have thoughts of hurting yourself or others. Get help right awayif you feel like you may  hurt yourself or others, or have thoughts about taking your own life. Go to your nearest emergency room or: Call 911. Call the National Suicide Prevention Lifeline at 1-800-273-8255 or 988. This is open 24 hours a day. Text the Crisis Text Line at 741741. Summary Myofascial pain syndrome and fibromyalgia are pain disorders. Myofascial pain syndrome has tender points in the muscles that will cause pain when pressed (trigger points). Fibromyalgia also has muscle pains and tenderness that come and go, but this condition is often associated with fatigue and sleep disturbances. Fibromyalgia and myofascial pain syndrome are not the same but often occur together, causing pain and fatigue that make day-to-day activities difficult. Follow your health care provider's instructions for taking medicines and maintaining a healthy lifestyle. This information is not intended to replace advice given to you by your health care provider. Make sure you discuss any questions you have with your health care provider. Document Revised: 03/07/2021 Document Reviewed: 03/07/2021 Elsevier Patient Education  2023 Elsevier Inc.  

## 2021-08-22 ENCOUNTER — Telehealth: Payer: Self-pay

## 2021-08-22 NOTE — Telephone Encounter (Signed)
Patient called stating at her appointment with Dr. Dimple Casey on 08/19/21 he agreed to fill out paperwork for [employer] so she could have a two-bedroom apartment.  Patient states she needs someone to live with her.  Patient states Marlou Porch from [employer] faxed the necessary forms to be completed and sent back.  Patient states her voucher is running out and needs the forms faxed back to [facility] ASAP.   ?Fax # [phone] ?Phone #[phone] ext [phone] ?

## 2021-08-25 NOTE — Telephone Encounter (Signed)
The requested document was completed today.

## 2021-08-26 NOTE — Telephone Encounter (Signed)
I called patient, documentation faxed. ?

## 2021-09-22 ENCOUNTER — Telehealth: Payer: Self-pay | Admitting: Internal Medicine

## 2021-09-22 NOTE — Telephone Encounter (Signed)
I called patient, patient verbalized understanding. 

## 2021-09-22 NOTE — Telephone Encounter (Signed)
I agree that sounds like a good plan to help her get better sleep. Her pain management doctor can provide the prescription for this which they are recommending. They should already have access to any documentation of our visits if needed.

## 2021-09-22 NOTE — Telephone Encounter (Signed)
Patient called the office stating Dr. Dimple Casey recently diagnosed her with Fibromyalgia. Patient states she looked into getting a Tempur-Pedic bed to help with the pain she is having. Patient states it will help stand her up and massage her body to help with the pain. Patient states her pain doctor is helping to apply for the bed. Patient states she needs a prescription written for the bed to be sent to [facility] because she cannot afford it herself and this this the only way she can get it.

## 2021-09-24 ENCOUNTER — Encounter
Payer: Medicaid Other | Attending: Physical Medicine and Rehabilitation | Admitting: Physical Medicine and Rehabilitation

## 2021-09-24 ENCOUNTER — Encounter: Payer: Self-pay | Admitting: Physical Medicine and Rehabilitation

## 2021-09-24 VITALS — BP 143/83 | HR 65 | Ht 62.0 in | Wt 299.2 lb

## 2021-09-24 DIAGNOSIS — M7918 Myalgia, other site: Secondary | ICD-10-CM | POA: Insufficient documentation

## 2021-09-24 DIAGNOSIS — M170 Bilateral primary osteoarthritis of knee: Secondary | ICD-10-CM | POA: Insufficient documentation

## 2021-09-24 DIAGNOSIS — M797 Fibromyalgia: Secondary | ICD-10-CM | POA: Insufficient documentation

## 2021-09-24 MED ORDER — PREGABALIN 50 MG PO CAPS: 50.0000 mg | ORAL_CAPSULE | Freq: Two times a day (BID) | ORAL | 5 refills | Status: AC

## 2021-09-24 NOTE — Patient Instructions (Addendum)
Pt is a 61 yr old female with hx of morbid obesity- BMI 50.5, HTN, as well as BPD and said has schizoid affective disorder as well- , and fibromyalgia per Rheumatology  as well as cervical radiculopathy and R shoulder pain - had RTC injury tear- x 2 surgery- and neck pain.  B/L knees- R hurts most-  Here for f/u on chronic pain  Needs to see primary Care physician and check on HbA1c and labs- checking kidneys and blood work. Especially because vision is poor and drinking a lot.   2. Start Lyrica 50 mg 2x/day x 1 week, then 50 mg in Am and 100 mg nightly x 1 week, then 100 mg 2x/day- for nerve/chronic pain.    3.  2nd week- continue Gabapentin 600 mg 2x/day for 1 week, then  decrease gabapentin to 300 mg 2x/day on 10/01/21  x 1 week, then stop.  4. Happy to write Rx for tempurpedic bed, however not sure how to get it filled- ask pt to look into this.    5. Needs to make appointment with Orthopedics to get knee and shoulder injections to help control pain.    6. Don't feel strong opiates is appropriate for Fibromyalgia.  It's against DEA recommendations, but want to treat pain with nerve pain meds- to help control pain.    7. F/U in 3 months- for Trigger point injections and f/u on Fibromyalgia.

## 2021-09-24 NOTE — Progress Notes (Signed)
Subjective:    Patient ID: Christina Richards, female    DOB: 05/28/1960, 61 y.o.   MRN: [id number]  HPI Pt is a 61 yr old female with hx of morbid obesity- BMI 50.5, HTN, as well as BPD and said has schizoid affective disorder as well- , and fibromyalgia per Rheumatology  as well as cervical radiculopathy and R shoulder pain - had RTC injury tear- x 2 surgery- and neck pain.  B/L knees- R hurts most- Hx of cocaine (+) in 6/22.   Sent to Rheumatology- no medications changed.   Was asking about tempurpedic bed.  Upset and thinks needs a certain kind of bed.   Really hurting- needs a new mattress and cnanot afford a regular mattress.   Wets self before gets off the bed- and has to use incontinence supplies.    Doesn't have anyone to get her to appointments.  [facility] for transportation.   Going to get back to eye doctor- glasses not working-  Also has depression anxiety and gets frustrated at the [employer].   Gabapentin is making her gain weight. 600 mg BID Also having HA's- gabapentin slows HA's but doesn't stop them.        Pain Inventory Average Pain 10 Pain Right Now 10 My pain is constant and aching  In the last 24 hours, has pain interfered with the following? General activity 10 Relation with others 10 Enjoyment of life 10 What TIME of day is your pain at its worst? morning , daytime, evening, and night Sleep (in general) Poor  Pain is worse with: walking, bending, and standing Pain improves with: heat/ice and medication Relief from Meds: 0  Family History  Family history unknown: Yes   Social History   Socioeconomic History   Marital status: Single    Spouse name: Not on file   Number of children: 1   Years of education: Not on file   Highest education level: 10th grade  Occupational History   Not on file  Tobacco Use   Smoking status: Every Day    Types: Cigars   Smokeless tobacco: Never   Tobacco comments:    Smoked cigarettes x 10 year 1 pack  daily  Vaping Use   Vaping Use: Never used  Substance and Sexual Activity   Alcohol use: No   Drug use: Not Currently    Types: "Crack" cocaine    Comment: 09/18/2020   Sexual activity: Never    Birth control/protection: Post-menopausal  Other Topics Concern   Not on file  Social History Narrative   Not on file   Social Determinants of Health   Financial Resource Strain: Not on file  Food Insecurity: Not on file  Transportation Needs: Not on file  Physical Activity: Not on file  Stress: Not on file  Social Connections: Not on file   Past Surgical History:  Procedure Laterality Date   ECTOPIC PREGNANCY SURGERY     HAND SURGERY Right    thumb fusion   RESECTION DISTAL CLAVICAL  09/26/2020   Procedure: ARTHROSCOPIC RESECTION DISTAL CLAVICAL;  Surgeon: Bjorn Pippin, MD;  Location: [facility];  Service: Orthopedics;;   ROTATOR CUFF REPAIR     SHOULDER ARTHROSCOPY WITH ROTATOR CUFF REPAIR AND SUBACROMIAL DECOMPRESSION Right 09/26/2020   Procedure: SHOULDER ARTHROSCOPY DEBRIDEMENT WITH SUBACROMIAL DECOMPRESSION AND DISTAL CLAVICLE EXCISION AND ARTHROSCOPIC REVISION ROTATOR CUFF REPAIR;  Surgeon: Bjorn Pippin, MD;  Location: [facility];  Service: Orthopedics;  Laterality: Right;  Past Surgical History:  Procedure Laterality Date   ECTOPIC PREGNANCY SURGERY     HAND SURGERY Right    thumb fusion   RESECTION DISTAL CLAVICAL  09/26/2020   Procedure: ARTHROSCOPIC RESECTION DISTAL CLAVICAL;  Surgeon: Bjorn PippinVarkey, Dax T, MD;  Location: [facility];  Service: Orthopedics;;   ROTATOR CUFF REPAIR     SHOULDER ARTHROSCOPY WITH ROTATOR CUFF REPAIR AND SUBACROMIAL DECOMPRESSION Right 09/26/2020   Procedure: SHOULDER ARTHROSCOPY DEBRIDEMENT WITH SUBACROMIAL DECOMPRESSION AND DISTAL CLAVICLE EXCISION AND ARTHROSCOPIC REVISION ROTATOR CUFF REPAIR;  Surgeon: Bjorn PippinVarkey, Dax T, MD;  Location: [facility];  Service: Orthopedics;  Laterality: Right;   Past  Medical History:  Diagnosis Date   Allergy    Environmental   Anemia    Anxiety    Arthritis    Bipolar disorder (HCC)    Cocaine abuse (HCC)    smokes crack   Coronary artery disease    Depression    GERD (gastroesophageal reflux disease)    History of borderline personality disorder    History of rotator cuff surgery 05/09/2017   right shoulder   Hypercholesteremia    Hypertension    Sleep apnea    does not use CPAP   BP (!) 143/83   Pulse 65   Ht 5\' 2"  (1.575 m)   Wt 299 lb 3.2 oz (135.7 kg)   LMP 12/08/2012 Comment: no periods since" I was 39"  SpO2 97%   BMI 54.72 kg/m   Opioid Risk Score:   Fall Risk Score:  `1  Dep[facility]     09/24/2021   10:03 AM 05/19/2021   10:44 AM 01/11/2020    9:06 AM 10/26/2019    2:14 PM 06/27/2019    2:16 PM 01/19/2019    9:11 AM 11/17/2018    2:19 PM  Depression screen PHQ 2/9  Decreased Interest 0 3 0 1 0 3 0  Down, Depressed, Hopeless 3 2 0 1 1 3  0  PHQ - 2 Score 3 5 0 2 1 6  0  Altered sleeping  3    3   Tired, decreased energy  3    3   Change in appetite  3    3   Feeling bad or failure about yourself   0    3   Trouble concentrating  0    3   Moving slowly or fidgety/restless  3    3   Suicidal thoughts  0    1   PHQ-9 Score  17    25   Difficult doing work/chores  Extremely dIfficult    Very difficult      Review of Systems  Constitutional: Negative.   HENT: Negative.    Eyes: Negative.   Respiratory: Negative.    Cardiovascular: Negative.   Gastrointestinal: Negative.   Endocrine: Negative.   Genitourinary: Negative.   Musculoskeletal:  Positive for back pain and gait problem.  Skin: Negative.   Allergic/Immunologic: Negative.   Neurological:  Positive for headaches.  Hematological: Negative.   Psychiatric/Behavioral:  Positive for dysphoric mood.       Objective:   Physical Exam  Awake, alert, very tearful; BMI 54.72; rubbing L knee, NAD Rubbing joints TTP all over c/w FMS.        Assessment & Plan:   Pt is a 61 yr old female with hx of morbid obesity- BMI 50.5, HTN, as well as BPD and said has schizoid affective disorder as well- ,  and fibromyalgia per Rheumatology  as well as cervical radiculopathy and R shoulder pain - had RTC injury tear- x 2 surgery- and neck pain.  B/L knees- R hurts most-  Here for f/u on chronic pain-   Needs to see primary Care physician and check on HbA1c and labs- checking kidneys and blood work. Especially because vision is poor and drinking a lot.   2. Start Lyrica 50 mg 2x/day x 1 week, then 50 mg in Am and 100 mg nightly x 1 week, then 100 mg 2x/day- for nerve/chronic pain.    3.  2nd week- continue current dose- decrease gabapentin to 300 mg 2x/day x 1 week, then stop.  4. Happy to write Rx for tempurpedic bed, however not sure how to get it filled- ask pt to look into this.    5. Needs to make appointment with Orthopedics to get knee and shoulder injections to help control pain.    6. Don't feel strong opiates is appropriate for Fibromyalgia.  It's against DEA recommendations, but want to treat pain with nerve pain meds- to help control pain.    7. F/U in 3 months- for Trigger point injections and f/u on Fibromyalgia.    I spent a total of 34    minutes on total care today- >50% coordination of care- due to discussion- of options for pain control.

## 2021-10-06 IMAGING — MR MR KNEE*L* W/O CM
6 series · 40 of 40 positions shown · non-contrast
Comparison: Report from 11/08/1995 radiographs

CLINICAL DATA: Chronic left knee pain and stiffness for several
years.

EXAM:
MRI OF THE LEFT KNEE WITHOUT CONTRAST
TECHNIQUE: Multiplanar, multisequence MR imaging of the knee was performed. No
intravenous contrast was administered.

[Series 8: T2 fat-sat · axial · left · 4.0mm · 0.50mm/px · z∈[-67,+57]mm · 5 of 26 slices shown (1 of 3)]
[im 1/26]
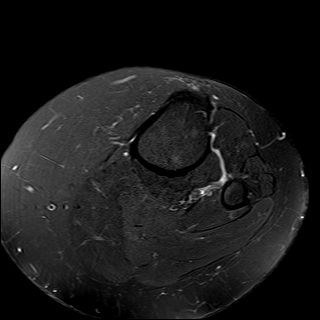
[im 7/26]
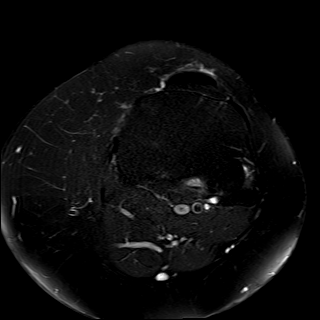
[im 13/26]
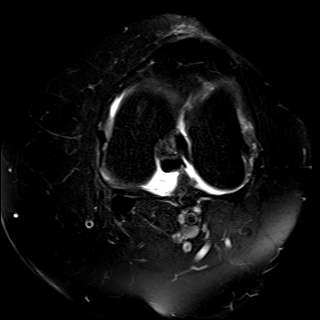
[im 19/26]
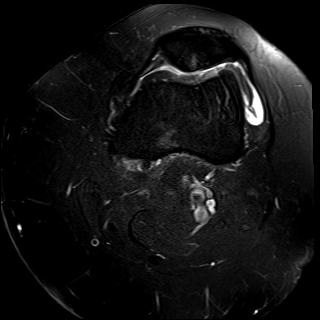
[im 26/26]
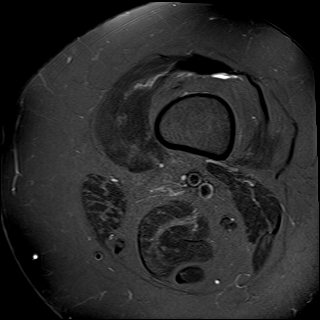

[Series 9: T1 · coronal · left · 4.0mm · 0.47mm/px · 7 of 32 slices shown]
[im 1/32]
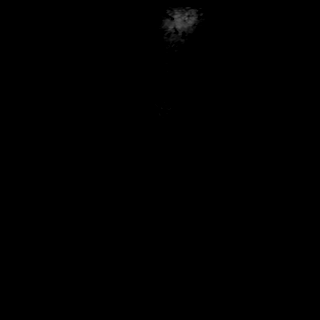
[im 6/32]
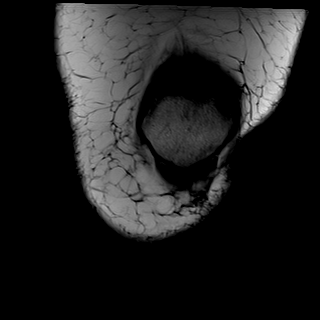
[im 11/32]
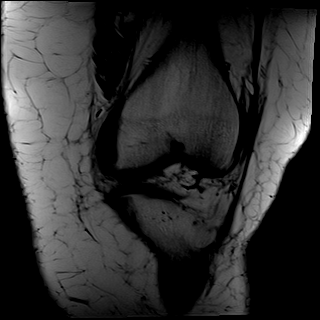
[im 16/32]
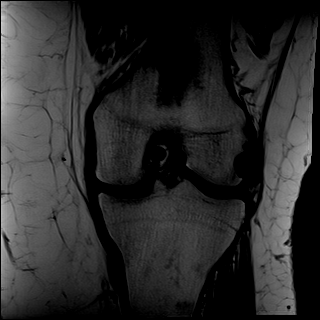
[im 21/32]
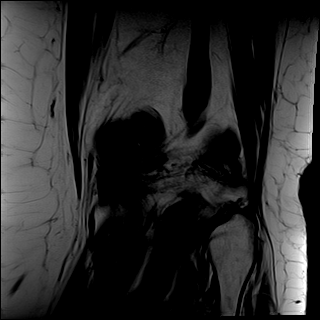
[im 26/32]
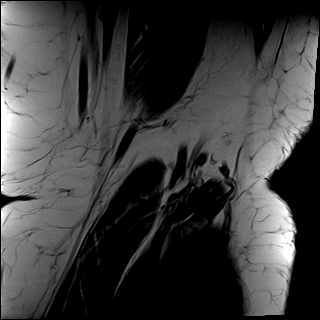
[im 32/32]
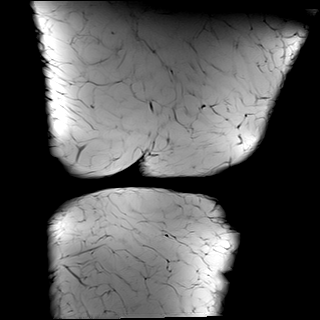

[Series 10: T2 fat-sat · coronal · left · 4.0mm · 0.47mm/px · 7 of 32 slices shown (2 of 3)]
[im 1/32]
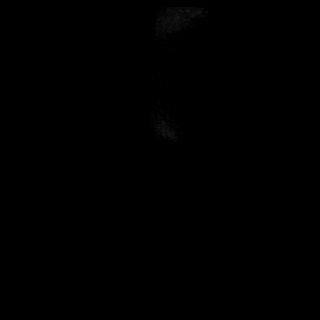
[im 6/32]
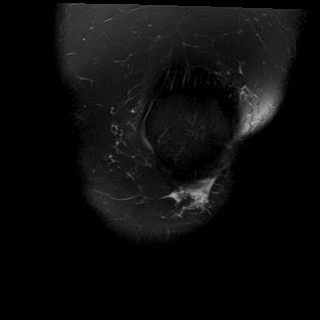
[im 11/32]
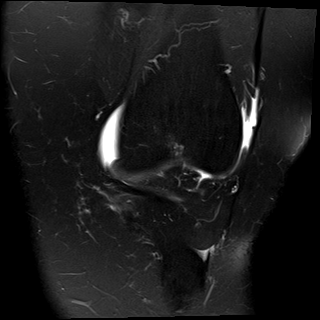
[im 16/32]
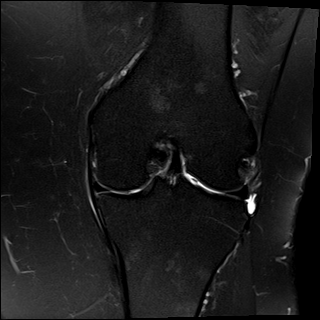
[im 21/32]
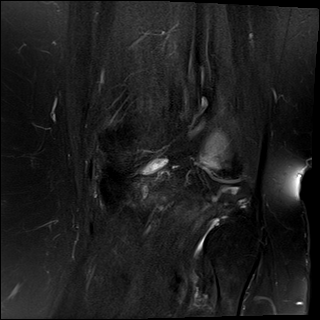
[im 26/32]
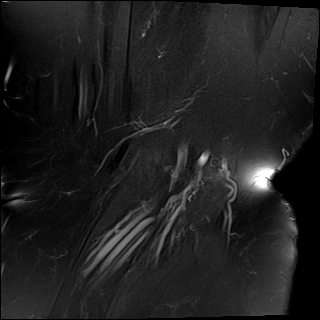
[im 32/32]
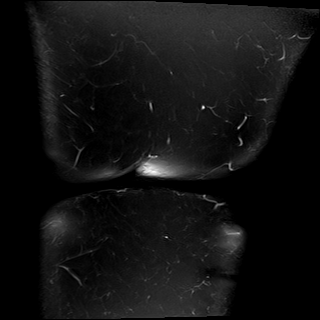

[Series 11: PD fat-sat · coronal · left · 4.0mm · 0.59mm/px · 7 of 32 slices shown (1 of 2)]
[im 1/32]
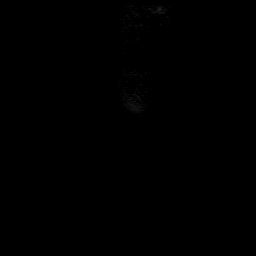
[im 6/32]
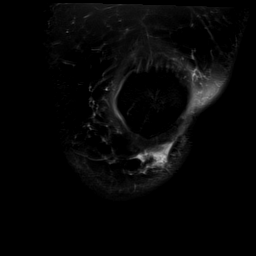
[im 11/32]
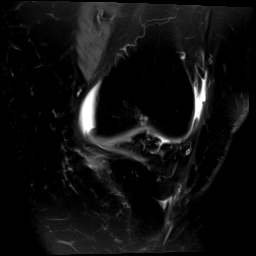
[im 16/32]
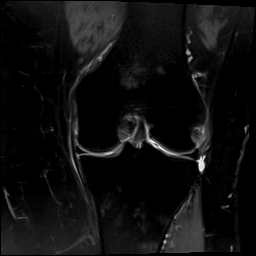
[im 21/32]
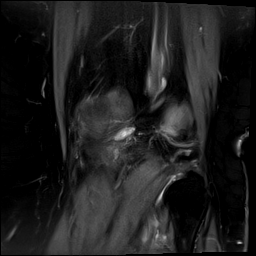
[im 26/32]
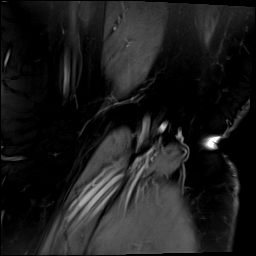
[im 32/32]
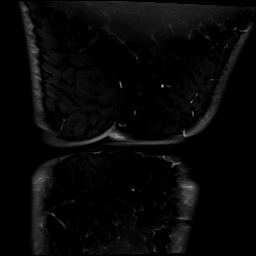

[Series 12: PD fat-sat · sagittal · left · 3.0mm · 0.47mm/px · 7 of 35 slices shown (2 of 2)]
[im 1/35]
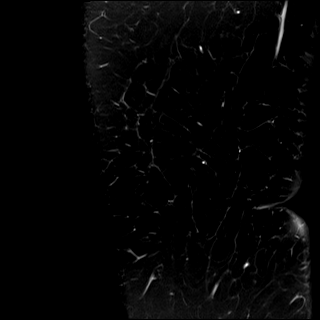
[im 6/35]
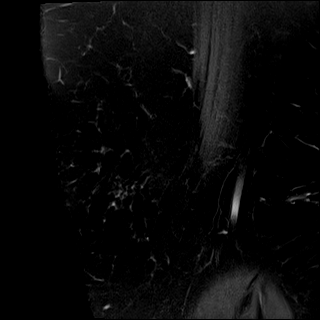
[im 12/35]
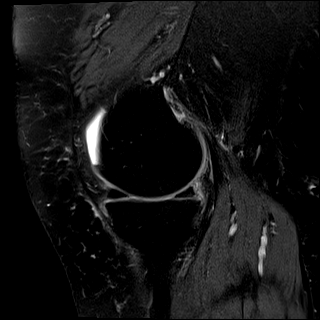
[im 18/35]
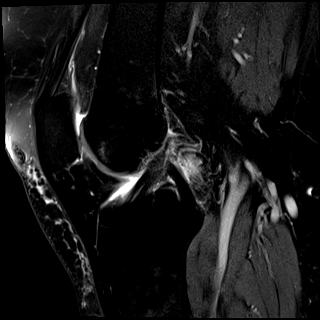
[im 23/35]
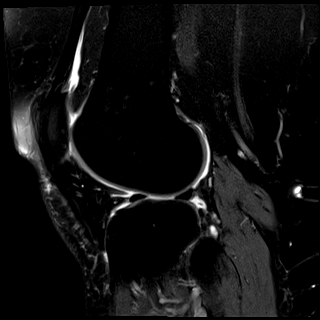
[im 29/35]
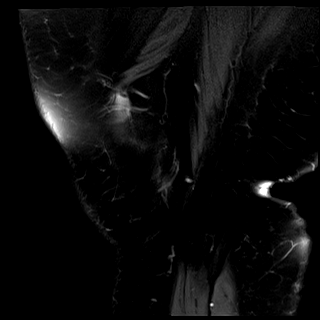
[im 35/35]
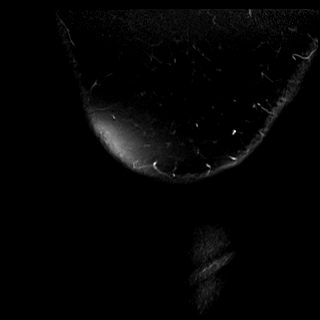

[Series 13: T2 fat-sat · sagittal · left · 3.0mm · 0.47mm/px · 7 of 36 slices shown (3 of 3)]
[im 1/36]
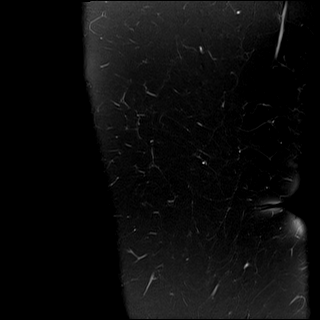
[im 6/36]
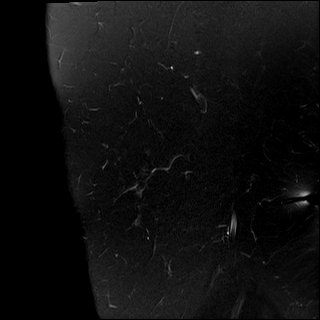
[im 12/36]
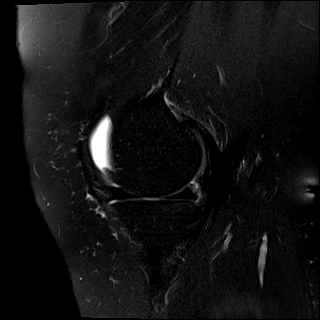
[im 18/36]
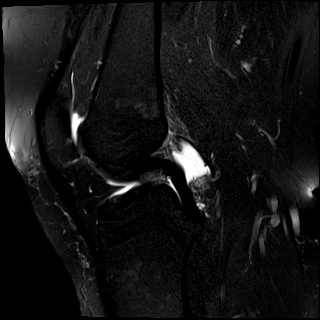
[im 24/36]
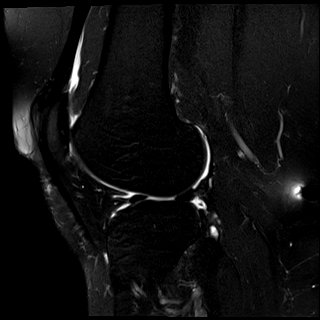
[im 30/36]
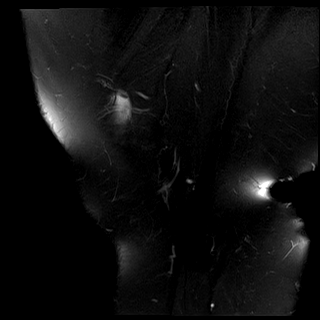
[im 36/36]
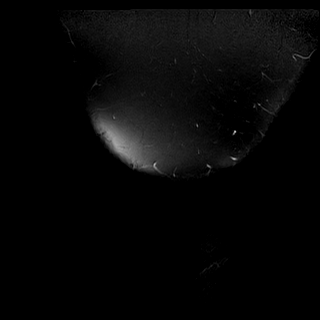

[40 of 40 positions shown; findings below may reference images not displayed]

FINDINGS: MENISCI

Medial meniscus:  Unremarkable

Lateral meniscus:  Unremarkable

LIGAMENTS

Cruciates:  Unremarkable

Collaterals: Mild edema tracks adjacent to the MCL. This can be
incidental but in the appropriate clinical circumstance could
represent grade 1 sprain.

CARTILAGE

Patellofemoral: Moderate to prominent degenerative chondral
thinning. Mild marginal spurring.

Medial: Moderate degenerative chondral thinning. Focal nearly
full-thickness chondral defect posteriorly along the medial femoral
condyle measuring about 0.5 by 0.6 cm, image 16 series 10. Minimal
marginal spurring.

Lateral: Mild chondral thinning. Mild degenerative marginal
spurring.

Joint:  Small knee effusion.

Popliteal Fossa:  Unremarkable

Extensor Mechanism:  Unremarkable

Bones: No significant extra-articular osseous abnormalities
identified.

Other: There is some mild prepatellar subcutaneous edema along with
an oval-shaped 0.8 by 0.5 by 0.6 cm low signal intensity structure
in the prepatellar subcutaneous adipose tissue which probably
represents a focus of calcified fat necrosis.
IMPRESSION: 1. Osteoarthritis most notable in the patellofemoral joint and
medial compartment.
2. Small knee effusion.
3. Mild edema tracks adjacent to the MCL. This can be incidental but
in the appropriate clinical circumstance could represent grade 1
sprain.
4. Small low signal intensity focus in the prepatellar subcutaneous
adipose tissue is probably a small benign focus of chronic calcified
fat necrosis.

## 2021-11-26 ENCOUNTER — Other Ambulatory Visit: Payer: Self-pay | Admitting: Family Medicine

## 2021-11-26 DIAGNOSIS — M79605 Pain in left leg: Secondary | ICD-10-CM

## 2021-11-28 ENCOUNTER — Ambulatory Visit
Admission: RE | Admit: 2021-11-28 | Discharge: 2021-11-28 | Disposition: A | Discharge status: Home / Self Care | Payer: Medicaid Other | Source: Ambulatory Visit | Attending: Nurse Practitioner | Admitting: Nurse Practitioner

## 2021-11-28 DIAGNOSIS — Z1231 Encounter for screening mammogram for malignant neoplasm of breast: Secondary | ICD-10-CM | POA: Diagnosis present

## 2021-12-04 ENCOUNTER — Ambulatory Visit
Admission: RE | Admit: 2021-12-04 | Discharge: 2021-12-04 | Disposition: A | Discharge status: Home / Self Care | Payer: Medicaid Other | Source: Ambulatory Visit | Attending: Family Medicine | Admitting: Family Medicine

## 2021-12-04 ENCOUNTER — Ambulatory Visit
Admission: RE | Admit: 2021-12-04 | Discharge: 2021-12-04 | Disposition: A | Discharge status: Home / Self Care | Payer: Medicaid Other | Attending: Family Medicine | Admitting: Family Medicine

## 2021-12-04 DIAGNOSIS — M79605 Pain in left leg: Secondary | ICD-10-CM | POA: Insufficient documentation

## 2021-12-12 ENCOUNTER — Ambulatory Visit
Admission: RE | Admit: 2021-12-12 | Discharge: 2021-12-12 | Disposition: A | Discharge status: Home / Self Care | Payer: Medicaid Other | Source: Ambulatory Visit | Attending: Orthopaedic Surgery | Admitting: Orthopaedic Surgery

## 2021-12-12 DIAGNOSIS — M5412 Radiculopathy, cervical region: Secondary | ICD-10-CM | POA: Insufficient documentation

## 2021-12-19 ENCOUNTER — Ambulatory Visit: Payer: Self-pay | Admitting: *Deleted

## 2021-12-19 ENCOUNTER — Telehealth: Payer: Medicaid Other | Admitting: Physician Assistant

## 2021-12-19 DIAGNOSIS — J4531 Mild persistent asthma with (acute) exacerbation: Secondary | ICD-10-CM | POA: Diagnosis not present

## 2021-12-19 DIAGNOSIS — J302 Other seasonal allergic rhinitis: Secondary | ICD-10-CM | POA: Diagnosis not present

## 2021-12-19 MED ORDER — PREDNISONE 20 MG PO TABS: 40.0000 mg | ORAL_TABLET | Freq: Every day | ORAL | 0 refills | Status: AC

## 2021-12-19 MED ORDER — LEVOCETIRIZINE DIHYDROCHLORIDE 5 MG PO TABS: 5.0000 mg | ORAL_TABLET | Freq: Every evening | ORAL | 1 refills | Status: DC

## 2021-12-19 MED ORDER — AZELASTINE HCL 0.05 % OP SOLN: 1.0000 [drp] | Freq: Two times a day (BID) | OPHTHALMIC | 0 refills | Status: AC

## 2021-12-19 NOTE — Patient Instructions (Signed)
Christina Richards, thank you for joining [name], PA-C for today's virtual visit.  While this provider is not your primary care provider (PCP), if your PCP is located in our provider database this encounter information will be shared with them immediately following your visit.  Consent: (Patient) Christina Richards provided verbal consent for this virtual visit at the beginning of the encounter.  Current Medications:  Current Outpatient Medications:    acetaminophen (TYLENOL) 500 MG tablet, Take by mouth. (Patient not taking: Reported on 08/19/2021), Disp: , Rfl:    atorvastatin (LIPITOR) 20 MG tablet, Take 20 mg by mouth at bedtime., Disp: , Rfl:    diclofenac (VOLTAREN) 75 MG EC tablet, Take 75 mg by mouth 2 (two) times daily as needed., Disp: , Rfl:    diltiazem (TIAZAC) 360 MG 24 hr capsule, Take 1 capsule (360 mg total) by mouth daily., Disp: 90 capsule, Rfl: 1   FLOVENT HFA 110 MCG/ACT inhaler, Inhale 2 puffs into the lungs 2 (two) times daily., Disp: , Rfl:    fluticasone (FLONASE) 50 MCG/ACT nasal spray, 2 sprays into each nostril two (2) times a day. (Patient not taking: Reported on 09/24/2021), Disp: , Rfl:    gabapentin (NEURONTIN) 300 MG capsule, Take 1 capsule (300 mg total) by mouth at bedtime. Take QHS x 3 days; then 300 mg 3x/day x 3 days, then 600 mg 2x/day- for chronic pain (Patient not taking: Reported on 09/24/2021), Disp: 120 capsule, Rfl: 5   Hypertonic Nasal Wash (NASAFLO NETI POT NASAL WASH) PACK, 3 each into each nostril in the morning and 3 each in the evening. Irrigate with one bottle twice daily. (Patient not taking: Reported on 08/19/2021), Disp: , Rfl:    Incontinence Supply Disposable (RA PADS FOR WOMEN) MISC, Use      Use 3-4 bed incontinence pads every time napping or sleeping, uses 8-10 in a day R32, Disp: , Rfl:    loratadine (CLARITIN) 10 MG tablet, Take 10 mg by mouth daily., Disp: , Rfl:    METAMUCIL FIBER PO, Take by mouth., Disp: , Rfl:    omeprazole (PRILOSEC  OTC) 20 MG tablet, Take 1 tablet by mouth daily., Disp: , Rfl:    OZEMPIC, 0.25 OR 0.5 MG/DOSE, 2 MG/1.5ML SOPN, Inject into the skin. (Patient not taking: Reported on 08/19/2021), Disp: , Rfl:    pregabalin (LYRICA) 50 MG capsule, Take 1 capsule (50 mg total) by mouth 2 (two) times daily. X 1 week, then 1 tab in Am and 2 tabs at night x 1 week, then 100 mg BID- for fibromyalgia., Disp: 120 capsule, Rfl: 5   PROVENTIL HFA 108 (90 Base) MCG/ACT inhaler, Inhale 2 puffs into the lungs every 4 (four) hours as needed., Disp: , Rfl:    Medications ordered in this encounter:  No orders of the defined types were placed in this encounter.    *If you need refills on other medications prior to your next appointment, please contact your pharmacy*  Follow-Up: Call back or seek an in-person evaluation if the symptoms worsen or if the condition fails to improve as anticipated.  Other Instructions   If you have been instructed to have an in-person evaluation today at a local Urgent Care facility, please use the link below. It will take you to a list of all of our available [facility], including address, phone number and hours of operation. Please do not delay care.  [facility]  If you or a  family member do not have a primary care provider, use the link below to schedule a visit and establish care. When you choose a [facility] primary care physician or advanced practice provider, you gain a long-term partner in health. Find a Primary Care Provider  Learn more about [facility]'s in-office and virtual care options: [facility]

## 2021-12-19 NOTE — Telephone Encounter (Signed)
  Chief Complaint: seasonal allergies Symptoms: stuffed nose, ears popping Frequency: constant Pertinent Negatives: Patient denies fever Disposition: [] ED /[] Urgent Care (no appt availability in office) / [] Appointment(In office/virtual)/ [x]  [facility]/ [] Home Care/ [] Refused Recommended Disposition /[] [facility]/ []  Follow-up with PCP Additional Notes: MyChart virtual UC visit scheduled for pt.   Reason for Disposition  [1] Taking antihistamines > 2 days AND [2] nasal allergy symptoms interfere with sleep, school, or work  Answer Assessment - Initial Assessment Questions 1. SYMPTOM: "What's the main symptom you're concerned about?" (e.g., runny nose, stuffiness, sneezing, itching)     Stuffy nose, ears popping 2. SEVERITY: "How bad is it?" "What does it keep you from doing?" (e.g., sleeping, working)      Gets worse when lays down 3. EYES: "Are the eyes also red, watery, and itchy?"      Watery and itch 4. TRIGGER: "What pollen or other allergic substance do you think is causing the symptoms?"      Had allergies every year 5. TREATMENT: "What medicine are you using?" "What medicine worked best in the past?"     Tried Claritin, Flonase not helping 6. OTHER SYMPTOMS: "Do you have any other symptoms?" (e.g., coughing, difficulty breathing, wheezing)     unsure 7. PREGNANCY: "Is there any chance you are pregnant?" "When was your last menstrual period?"     no  Protocols used: Nasal Allergies (Hay Fever)-A-AH

## 2021-12-19 NOTE — Progress Notes (Signed)
Virtual Visit Consent   Christina Richards, you are scheduled for a virtual visit with a [facility] provider today. Just as with appointments in the office, your consent must be obtained to participate. Your consent will be active for this visit and any virtual visit you may have with one of our providers in the next 365 days. If you have a MyChart account, a copy of this consent can be sent to you electronically.  As this is a virtual visit, video technology does not allow for your provider to perform a traditional examination. This may limit your provider's ability to fully assess your condition. If your provider identifies any concerns that need to be evaluated in person or the need to arrange testing (such as labs, EKG, etc.), we will make arrangements to do so. Although advances in technology are sophisticated, we cannot ensure that it will always work on either your end or our end. If the connection with a video visit is poor, the visit may have to be switched to a telephone visit. With either a video or telephone visit, we are not always able to ensure that we have a secure connection.  By engaging in this virtual visit, you consent to the provision of healthcare and authorize for your insurance to be billed (if applicable) for the services provided during this visit. Depending on your insurance coverage, you may receive a charge related to this service.  I need to obtain your verbal consent now. Are you willing to proceed with your visit today? [name] has provided verbal consent on 12/19/2021 for a virtual visit (video or telephone). [name], [place]  Date: 12/19/2021 2:35 PM  Virtual Visit via Video Note   I, [name], connected with  [name]  ([id number], 07-02-1960) on 12/19/21 at  2:30 PM EDT by a video-enabled telemedicine application and verified that I am speaking with the correct person using two identifiers.  Location: Patient: Virtual Visit Location Patient:  Home Provider: Virtual Visit Location Provider: Home Office   I discussed the limitations of evaluation and management by telemedicine and the availability of in person appointments. The patient expressed understanding and agreed to proceed.    History of Present Illness: Christina Richards is a 61 y.o. who identifies as a female who was assigned female at birth, and is being seen today for nasal congestion, rhinorrhea, itchy/watery eyes, ear fullness and pressure. Notes significant PND. Denies fever, chills. Denies recent travel or sick contact.. Is taking OTC Zyrtec and Flonase without any relief.. Notes increased chest tightness and wheezing despite inhalers.  HPI: HPI  Problems:  Patient Active Problem List   Diagnosis Date Noted   Myofascial pain dysfunction syndrome 09/24/2021   Fibromyalgia syndrome 08/19/2021   Bilateral primary osteoarthritis of knee 08/19/2021   Obesity, morbid (HCC) 05/19/2021   Depression, recurrent (HCC) 11/01/2019   Encounter for screening mammogram for malignant neoplasm of breast 11/01/2019   Stress incontinence of urine 10/11/2019   Encounter for general adult medical examination with abnormal findings 06/28/2019   Chronic otitis externa of both ears 06/28/2019   Irritable bowel syndrome with both constipation and diarrhea 06/28/2019   Exposure to STD 06/28/2019   Routine cervical smear 06/28/2019   Dysuria 06/28/2019   Illicit drug use 06/28/2019   Hyperlipidemia 01/19/2019   Depression, major, single episode, moderate (HCC) 01/19/2019   Cocaine dependence without complication (HCC) 01/19/2019   Chronic low back pain without sciatica 07/25/2017   Acute vaginitis  05/05/2017   Candidiasis of vulva and vagina 05/05/2017   Acute sinusitis, unspecified 05/05/2017   Essential (primary) hypertension 05/05/2017   Body mass index 45.0-49.9, adult (HCC) 05/05/2017   Status post rotator cuff repair 07/17/2014   Complete tear of right rotator cuff 01/04/2014     Allergies:  Allergies  Allergen Reactions   Ibuprofen Nausea Only   Medications:  Current Outpatient Medications:    azelastine (OPTIVAR) 0.05 % ophthalmic solution, Place 1 drop into both eyes 2 (two) times daily., Disp: 6 mL, Rfl: 0   levocetirizine (XYZAL) 5 MG tablet, Take 1 tablet (5 mg total) by mouth every evening., Disp: 30 tablet, Rfl: 1   predniSONE (DELTASONE) 20 MG tablet, Take 2 tablets (40 mg total) by mouth daily with breakfast for 5 days., Disp: 10 tablet, Rfl: 0   acetaminophen (TYLENOL) 500 MG tablet, Take by mouth. (Patient not taking: Reported on 08/19/2021), Disp: , Rfl:    atorvastatin (LIPITOR) 20 MG tablet, Take 20 mg by mouth at bedtime., Disp: , Rfl:    diclofenac (VOLTAREN) 75 MG EC tablet, Take 75 mg by mouth 2 (two) times daily as needed., Disp: , Rfl:    diltiazem (TIAZAC) 360 MG 24 hr capsule, Take 1 capsule (360 mg total) by mouth daily., Disp: 90 capsule, Rfl: 1   FLOVENT HFA 110 MCG/ACT inhaler, Inhale 2 puffs into the lungs 2 (two) times daily., Disp: , Rfl:    fluticasone (FLONASE) 50 MCG/ACT nasal spray, 2 sprays into each nostril two (2) times a day. (Patient not taking: Reported on 09/24/2021), Disp: , Rfl:    Incontinence Supply Disposable (RA PADS FOR WOMEN) MISC, Use      Use 3-4 bed incontinence pads every time napping or sleeping, uses 8-10 in a day R32, Disp: , Rfl:    METAMUCIL FIBER PO, Take by mouth., Disp: , Rfl:    pregabalin (LYRICA) 50 MG capsule, Take 1 capsule (50 mg total) by mouth 2 (two) times daily. X 1 week, then 1 tab in Am and 2 tabs at night x 1 week, then 100 mg BID- for fibromyalgia., Disp: 120 capsule, Rfl: 5   PROVENTIL HFA 108 (90 Base) MCG/ACT inhaler, Inhale 2 puffs into the lungs every 4 (four) hours as needed., Disp: , Rfl:   Observations/Objective: Patient is well-developed, well-nourished in no acute distress.  Resting comfortably at home.  Head is normocephalic, atraumatic.  No labored breathing. Speech is clear and  coherent with logical content.  Patient is alert and oriented at baseline.   Assessment and Plan: 1. Seasonal allergic rhinitis, unspecified trigger - levocetirizine (XYZAL) 5 MG tablet; Take 1 tablet (5 mg total) by mouth every evening.  Dispense: 30 tablet; Refill: 1 - azelastine (OPTIVAR) 0.05 % ophthalmic solution; Place 1 drop into both eyes 2 (two) times daily.  Dispense: 6 mL; Refill: 0  2. Mild persistent asthma with exacerbation - predniSONE (DELTASONE) 20 MG tablet; Take 2 tablets (40 mg total) by mouth daily with breakfast for 5 days.  Dispense: 10 tablet; Refill: 0  Stop OTC zyrtec. Start Xyzal once daily. Continue Flonase. Start Optivar drops. Recommend Mucinex-DM. Continue Flovent. Will add on short burst of prednisone due to exacerbation of asthma. Strict follow-up precautions discussed.   Follow Up Instructions: I discussed the assessment and treatment plan with the patient. The patient was provided an opportunity to ask questions and all were answered. The patient agreed with the plan and demonstrated an understanding of the instructions.  A copy  of instructions were sent to the patient via MyChart unless otherwise noted below.    The patient was advised to call back or seek an in-person evaluation if the symptoms worsen or if the condition fails to improve as anticipated.  Time:  I spent 8 minutes with the patient via telehealth technology discussing the above problems/concerns.    [name], PA-C

## 2022-01-21 ENCOUNTER — Encounter
Payer: Medicaid Other | Attending: Physical Medicine and Rehabilitation | Admitting: Physical Medicine and Rehabilitation

## 2022-01-27 ENCOUNTER — Ambulatory Visit (INDEPENDENT_AMBULATORY_CARE_PROVIDER_SITE_OTHER): Payer: Medicaid Other

## 2022-01-27 ENCOUNTER — Ambulatory Visit: Payer: Medicaid Other | Admitting: Podiatry

## 2022-01-27 DIAGNOSIS — M2141 Flat foot [pes planus] (acquired), right foot: Secondary | ICD-10-CM

## 2022-01-27 DIAGNOSIS — M2142 Flat foot [pes planus] (acquired), left foot: Secondary | ICD-10-CM

## 2022-01-27 DIAGNOSIS — Z01818 Encounter for other preprocedural examination: Secondary | ICD-10-CM | POA: Diagnosis not present

## 2022-01-27 DIAGNOSIS — M216X1 Other acquired deformities of right foot: Secondary | ICD-10-CM | POA: Diagnosis not present

## 2022-01-27 DIAGNOSIS — M19071 Primary osteoarthritis, right ankle and foot: Secondary | ICD-10-CM

## 2022-01-27 NOTE — Progress Notes (Unsigned)
Subjective:  Patient ID: Christina Richards, female    DOB: 20-Nov-1960,  MRN: [id number]  Chief Complaint  Patient presents with   Foot Pain    "I have pain in this big toe on my right foot and I have pain in the arch of both feet." N - pain in the big toe area L - bunion right D - 4-5 years O - gradually gotten worse C - sharp pain A - stand too much or walk too much T - Icy Hot  N - pain in the arch L - bilateral D - since early 21s O - gradually worse C - swells, sharp pain A - shoes with an arch, walking a lot T - Icy Hot Pads, wrap with an ace bandage    61 y.o. female presents with the above complaint.  Patient presents with right first metatarsophalangeal joint severe arthritis.  See above for further HPI.  She states that she has failed all conservative treatment options she would like to discuss surgical options at this time.  Pain scale is 8 out of 10 hurts with ambulation worse with pressure right side is greater than left side   Review of Systems: Negative except as noted in the HPI. Denies N/V/F/Ch.  Past Medical History:  Diagnosis Date   Allergy    Environmental   Anemia    Anxiety    Arthritis    Bipolar disorder ([place])    Cocaine abuse (HCC)    smokes crack   Coronary artery disease    Depression    GERD (gastroesophageal reflux disease)    History of borderline personality disorder    History of rotator cuff surgery 05/09/2017   right shoulder   Hypercholesteremia    Hypertension    Sleep apnea    does not use CPAP    Current Outpatient Medications:    atorvastatin (LIPITOR) 20 MG tablet, Take 20 mg by mouth at bedtime., Disp: , Rfl:    azelastine (OPTIVAR) 0.05 % ophthalmic solution, Place 1 drop into both eyes 2 (two) times daily., Disp: 6 mL, Rfl: 0   diclofenac (VOLTAREN) 75 MG EC tablet, Take 75 mg by mouth 2 (two) times daily as needed., Disp: , Rfl:    diltiazem (TIAZAC) 360 MG 24 hr capsule, Take 1 capsule (360 mg total) by mouth daily.,  Disp: 90 capsule, Rfl: 1   FLOVENT HFA 110 MCG/ACT inhaler, Inhale 2 puffs into the lungs 2 (two) times daily., Disp: , Rfl:    fluticasone (FLONASE) 50 MCG/ACT nasal spray, , Disp: , Rfl:    Incontinence Supply Disposable (RA PADS FOR WOMEN) MISC, Use      Use 3-4 bed incontinence pads every time napping or sleeping, uses 8-10 in a day R32, Disp: , Rfl:    levocetirizine (XYZAL) 5 MG tablet, Take 1 tablet (5 mg total) by mouth every evening., Disp: 30 tablet, Rfl: 1   METAMUCIL FIBER PO, Take by mouth., Disp: , Rfl:    pregabalin (LYRICA) 50 MG capsule, Take 1 capsule (50 mg total) by mouth 2 (two) times daily. X 1 week, then 1 tab in Am and 2 tabs at night x 1 week, then 100 mg BID- for fibromyalgia., Disp: 120 capsule, Rfl: 5   PROVENTIL HFA 108 (90 Base) MCG/ACT inhaler, Inhale 2 puffs into the lungs every 4 (four) hours as needed., Disp: , Rfl:    acetaminophen (TYLENOL) 500 MG tablet, Take by mouth. (Patient not taking: Reported on 08/19/2021), Disp: ,  Rfl:   Social History   Tobacco Use  Smoking Status Every Day   Types: Cigars  Smokeless Tobacco Never  Tobacco Comments   Smoked cigarettes x 10 year 1 pack daily    Allergies  Allergen Reactions   Ibuprofen Nausea Only   Objective:  There were no vitals filed for this visit. There is no height or weight on file to calculate BMI. Constitutional Well developed. Well nourished.  Vascular Dorsalis pedis pulses palpable bilaterally. Posterior tibial pulses palpable bilaterally. Capillary refill normal to all digits.  No cyanosis or clubbing noted. Pedal hair growth normal.  Neurologic Normal speech. Oriented to person, place, and time. Epicritic sensation to light touch grossly present bilaterally.  Dermatologic Nails well groomed and normal in appearance. No open wounds. No skin lesions.  Orthopedic: Pain on palpation right first metatarsophalangeal joint pain with range of motion of the joint very limited range of motion  noted hallux rigidus noted crepitus noted deep intra-articular pain noted.  No extensor or flexor tendinitis noted no sesamoidal complex noted   Radiographs: 3 views of skeletally mature adult bilateral foot right greater than left side.  Severe osteoarthritic changes noted to the first metatarsal joint.  This is well bilaterally.  No other bony abnormalities noted pes planovalgus foot structure noted. Assessment:   1. Arthritis of first metatarsophalangeal (MTP) joint of right foot   2. Encounter for preoperative examination for general surgical procedure    Plan:  Patient was evaluated and treated and all questions answered.  Bilateral pes planovalgus right greater than left side -All Russians and concerns were discussed with the patient extensive detail given the amount of pain that she is experiencing she will benefit from surgical fusion of the right side I discussed my preoperative intra postop plan in extensive detail she states understanding would like to proceed with surgery. -We will plan on doing first metatarsophalangeal joint fusion.  She will be nonweightbearing to the right lower extremity with knee scooter or crutches.  She states understand like to proceed with surgery -Informed surgical risk consent was reviewed and read aloud to the patient.  I reviewed the films.  I have discussed my findings with the patient in great detail.  I have discussed all risks including but not limited to infection, stiffness, scarring, limp, disability, deformity, damage to blood vessels and nerves, numbness, poor healing, need for braces, arthritis, chronic pain, amputation, death.  All benefits and realistic expectations discussed in great detail.  I have made no promises as to the outcome.  I have provided realistic expectations.  I have offered the patient a 2nd opinion, which they have declined and assured me they preferred to proceed despite the risks   No follow-ups on file. Right first MPJ  arthritis fusion sx

## 2022-05-18 DIAGNOSIS — J209 Acute bronchitis, unspecified: Secondary | ICD-10-CM | POA: Diagnosis not present

## 2022-05-18 DIAGNOSIS — Z20822 Contact with and (suspected) exposure to covid-19: Secondary | ICD-10-CM | POA: Diagnosis not present

## 2022-05-18 DIAGNOSIS — Z0131 Encounter for examination of blood pressure with abnormal findings: Secondary | ICD-10-CM | POA: Diagnosis not present

## 2022-05-18 DIAGNOSIS — J45998 Other asthma: Secondary | ICD-10-CM | POA: Diagnosis not present

## 2022-06-19 NOTE — Progress Notes (Signed)
Sent message, via epic in basket, requesting orders in epic from surgeon.  

## 2022-06-23 DIAGNOSIS — F251 Schizoaffective disorder, depressive type: Secondary | ICD-10-CM | POA: Diagnosis not present

## 2022-06-23 DIAGNOSIS — R32 Unspecified urinary incontinence: Secondary | ICD-10-CM | POA: Diagnosis not present

## 2022-06-23 NOTE — Progress Notes (Addendum)
Anesthesia Review:  PCP: Cardiologist : Chest x-ray : EKG : Echo : Stress test: Cardiac Cath :  Activity level:  Sleep Study/ CPAP : Fasting Blood Sugar :      / Checks Blood Sugar -- times a day:   Blood Thinner/ Instructions /Last Dose: ASA / Instructions/ Last Dose :    Requested orders on 06/24/22

## 2022-06-24 ENCOUNTER — Encounter: Payer: Medicaid Other | Admitting: Physical Medicine and Rehabilitation

## 2022-06-24 NOTE — Patient Instructions (Addendum)
SURGICAL WAITING ROOM VISITATION  Patients having surgery or a procedure may have no more than 2 support people in the waiting area - these visitors may rotate.    Children under the age of 94 must have an adult with them who is not the patient.  Due to an increase in RSV and influenza rates and associated hospitalizations, children ages 48 and under may not visit patients in [place].  If the patient needs to stay at the hospital during part of their recovery, the visitor guidelines for inpatient rooms apply. Pre-op nurse will coordinate an appropriate time for 1 support person to accompany patient in pre-op.  This support person may not rotate.    Please refer to the [facility] website for the visitor guidelines for Inpatients (after your surgery is over and you are in a regular room).       Your procedure is scheduled on: 06/29/2022    Report to [facility] Main Entrance    Report to admitting at  100 pm    Call this number if you have problems the morning of surgery [phone]   Do not eat food  or drink  liquids :After Midnight.                               If you have questions, please contact your surgeon's office.       Oral Hygiene is also important to reduce your risk of infection.                                    Remember - BRUSH YOUR TEETH THE MORNING OF SURGERY WITH YOUR REGULAR TOOTHPASTE  DENTURES WILL BE REMOVED PRIOR TO SURGERY PLEASE DO NOT APPLY "Poly grip" OR ADHESIVES!!!   Do NOT smoke after Midnight   Take these medicines the morning of surgery with A SIP OF WATER:  Diltiazem if due to take , Inhalers as usual and bring, omeprazole if needed   DO NOT TAKE ANY ORAL DIABETIC MEDICATIONS DAY OF YOUR SURGERY  Bring CPAP mask and tubing day of surgery.                              You may not have any metal on your body including hair pins, jewelry, and body piercing             Do not wear make-up, lotions, powders,  perfumes/cologne, or deodorant  Do not wear nail polish including gel and S&S, artificial/acrylic nails, or any other type of covering on natural nails including finger and toenails. If you have artificial nails, gel coating, etc. that needs to be removed by a nail salon please have this removed prior to surgery or surgery may need to be canceled/ delayed if the surgeon/ anesthesia feels like they are unable to be safely monitored.   Do not shave  48 hours prior to surgery.               Men may shave face and neck.   Do not bring valuables to the hospital. [place].   Contacts, glasses, dentures or bridgework may not be worn into surgery.   Bring small overnight  bag day of surgery.   DO NOT [place]. PHARMACY WILL DISPENSE MEDICATIONS LISTED ON YOUR MEDICATION LIST TO YOU DURING YOUR ADMISSION [place]!    Patients discharged on the day of surgery will not be allowed to drive home.  Someone NEEDS to stay with you for the first 24 hours after anesthesia.   Special Instructions: Bring a copy of your healthcare power of attorney and living will documents the day of surgery if you haven't scanned them before.              Please read over the following fact sheets you were given: IF [place] [phone]   If you received a COVID test during your pre-op visit  it is requested that you wear a mask when out in public, stay away from anyone that may not be feeling well and notify your surgeon if you develop symptoms. If you test positive for Covid or have been in contact with anyone that has tested positive in the last 10 days please notify you surgeon.    [facility] - Preparing for Surgery Before surgery, you can play an important role.  Because skin is not sterile, your skin needs to be as free of germs as possible.  You can reduce the number of  germs on your skin by washing with CHG (chlorahexidine gluconate) soap before surgery.  CHG is an antiseptic cleaner which kills germs and bonds with the skin to continue killing germs even after washing. Please DO NOT use if you have an allergy to CHG or antibacterial soaps.  If your skin becomes reddened/irritated stop using the CHG and inform your nurse when you arrive at Short Stay. Do not shave (including legs and underarms) for at least 48 hours prior to the first CHG shower.  You may shave your face/neck. Please follow these instructions carefully:  1.  Shower with CHG Soap the night before surgery and the  morning of Surgery.  2.  If you choose to wash your hair, wash your hair first as usual with your  normal  shampoo.  3.  After you shampoo, rinse your hair and body thoroughly to remove the  shampoo.                           4.  Use CHG as you would any other liquid soap.  You can apply chg directly  to the skin and wash                       Gently with a scrungie or clean washcloth.  5.  Apply the CHG Soap to your body ONLY FROM THE NECK DOWN.   Do not use on face/ open                           Wound or open sores. Avoid contact with eyes, ears mouth and genitals (private parts).                       Wash face,  Genitals (private parts) with your normal soap.             6.  Wash thoroughly, paying special attention to the area where your surgery  will be performed.  7.  Thoroughly rinse your body with warm water  from the neck down.  8.  DO NOT shower/wash with your normal soap after using and rinsing off  the CHG Soap.                9.  Pat yourself dry with a clean towel.            10.  Wear clean pajamas.            11.  Place clean sheets on your bed the night of your first shower and do not  sleep with pets. Day of Surgery : Do not apply any lotions/deodorants the morning of surgery.  Please wear clean clothes to the hospital/surgery center.  FAILURE TO FOLLOW THESE  INSTRUCTIONS MAY RESULT IN THE CANCELLATION OF YOUR SURGERY PATIENT SIGNATURE_________________________________  NURSE SIGNATURE__________________________________  ________________________________________________________________________

## 2022-06-26 ENCOUNTER — Encounter (HOSPITAL_COMMUNITY)
Admission: RE | Admit: 2022-06-26 | Discharge: 2022-06-26 | Disposition: A | Discharge status: Home / Self Care | Payer: Medicaid Other | Source: Ambulatory Visit

## 2022-06-26 ENCOUNTER — Telehealth: Payer: Self-pay | Admitting: Urology

## 2022-06-26 NOTE — Telephone Encounter (Signed)
I received pt H&P and pcp stated that she needs to be evaluated and cleared by cardiology 1st. I called and spoke with pt that she would need to get an appt to see cardio and them clear her for sx. Pt stated that she would call and make an appt that for now we would cancel her sx. I told pt that I would have shelly call her on Monday to reschedule sx and she could inform shelly when her appt with cardio is. I have informed [facility] and Dr. Posey Pronto of this change.

## 2022-06-29 ENCOUNTER — Encounter (HOSPITAL_COMMUNITY): Admission: RE | Payer: Self-pay | Source: Ambulatory Visit

## 2022-06-29 ENCOUNTER — Telehealth: Payer: Self-pay

## 2022-06-29 ENCOUNTER — Ambulatory Visit (HOSPITAL_COMMUNITY): Admission: RE | Admit: 2022-06-29 | Payer: Medicaid Other | Source: Ambulatory Visit | Admitting: Podiatry

## 2022-06-29 SURGERY — FUSION, JOINT, GREAT TOE
Anesthesia: Choice | Laterality: Right

## 2022-06-29 NOTE — Telephone Encounter (Signed)
I called Christina Richards to reschedule her surgery with Dr. Posey Pronto. She stated she will not go to [facility] to have surgery. I explained to her that she could see one of the other providers in the office if she wanted to have her surgery at [facility] or [facility]. She stated she will  schedule an appointment with one of our other providers after she finds out what is going on with her heart.

## 2022-07-01 DIAGNOSIS — F1220 Cannabis dependence, uncomplicated: Secondary | ICD-10-CM | POA: Diagnosis not present

## 2022-07-01 DIAGNOSIS — F1420 Cocaine dependence, uncomplicated: Secondary | ICD-10-CM | POA: Diagnosis not present

## 2022-07-06 ENCOUNTER — Ambulatory Visit: Payer: Medicaid Other | Attending: Physician Assistant | Admitting: Physician Assistant

## 2022-07-06 NOTE — Progress Notes (Deleted)
Office Visit    Patient Name: Christina Richards Date of Encounter: 07/06/2022  PCP:  [facility]  Cardiologist:  Kate Sable, MD  Advanced Practice Provider:  No care team member to display Electrophysiologist:  None   HPI    Christina Richards is a 62 y.o. female with past history of hypertension, hyperlipidemia, current smoker, obesity presents today for follow-up visit for irregular heartbeats.  The patient was seen about a year ago and at that time she was evaluated for irregular heartbeat.  Denied palpitations, dizziness, syncope.  She was smoking and was working on quitting at that time.  Recently was started on Ozempic for weight loss.  Denied history of heart disease.  Was feeling okay that day.  Today, she***  Past Medical History    Past Medical History:  Diagnosis Date   Allergy    Environmental   Anemia    Anxiety    Arthritis    Bipolar disorder ([place])    Cocaine abuse (HCC)    smokes crack   Coronary artery disease    Depression    GERD (gastroesophageal reflux disease)    History of borderline personality disorder    History of rotator cuff surgery 05/09/2017   right shoulder   Hypercholesteremia    Hypertension    Sleep apnea    does not use CPAP   Past Surgical History:  Procedure Laterality Date   ECTOPIC PREGNANCY SURGERY     HAND SURGERY Right    thumb fusion   RESECTION DISTAL CLAVICAL  09/26/2020   Procedure: ARTHROSCOPIC RESECTION DISTAL CLAVICAL;  Surgeon: Hiram Gash, MD;  Location: [facility];  Service: Orthopedics;;   ROTATOR CUFF REPAIR     SHOULDER ARTHROSCOPY WITH ROTATOR CUFF REPAIR AND SUBACROMIAL DECOMPRESSION Right 09/26/2020   Procedure: SHOULDER ARTHROSCOPY DEBRIDEMENT WITH SUBACROMIAL DECOMPRESSION AND DISTAL CLAVICLE EXCISION AND ARTHROSCOPIC REVISION ROTATOR CUFF REPAIR;  Surgeon: Hiram Gash, MD;  Location: [facility];  Service:  Orthopedics;  Laterality: Right;    Allergies  Allergies  Allergen Reactions   Ibuprofen Nausea Only    EKGs/Labs/Other Studies Reviewed:   The following studies were reviewed today:   EKG:  EKG is *** ordered today.  The ekg ordered today demonstrates ***  Recent Labs: No results found for requested labs within last 365 days.  Recent Lipid Panel    Component Value Date/Time   CHOL 230 (H) 12/06/2018 1134   TRIG 140 12/06/2018 1134   HDL 67 12/06/2018 1134   LDLCALC 135 (H) 12/06/2018 1134    Risk Assessment/Calculations:  {Does this patient have ATRIAL FIBRILLATION?[phone]}  Home Medications   No outpatient medications have been marked as taking for the 07/06/22 encounter (Appointment) with Elgie Collard, PA-C.     Review of Systems   ***   All other systems reviewed and are otherwise negative except as noted above.  Physical Exam    VS:  LMP 12/08/2012 Comment: no periods since" I was 39" , BMI There is no height or weight on file to calculate BMI.  Wt Readings from Last 3 Encounters:  09/24/21 299 lb 3.2 oz (135.7 kg)  08/19/21 (!) 303 lb (137.4 kg)  06/30/21 292 lb (132.5 kg)     GEN: Well nourished, well developed, in no acute distress. HEENT: normal. Neck: Supple, no JVD, carotid bruits, or masses. Cardiac: ***RRR, no murmurs, rubs, or gallops. No clubbing,  cyanosis, edema.  ***Radials/PT 2+ and equal bilaterally.  Respiratory:  ***Respirations regular and unlabored, clear to auscultation bilaterally. GI: Soft, nontender, nondistended. MS: No deformity or atrophy. Skin: Warm and dry, no rash. Neuro:  Strength and sensation are intact. Psych: Normal affect.  Assessment & Plan    Irregular heartbeats (PACs?) Hypertension Morbid obesity Smoking  No BP recorded.  {Refresh Note OR Click here to enter BP  :1}***      Disposition: Follow up {follow up:15908} with Kate Sable, MD or APP.  Signed, Elgie Collard, PA-C 07/06/2022, 12:54  PM [facility]

## 2022-07-07 ENCOUNTER — Encounter: Payer: Medicaid Other | Admitting: Podiatry

## 2022-07-10 ENCOUNTER — Encounter: Payer: Medicaid Other | Admitting: Podiatry

## 2022-07-15 DIAGNOSIS — F1220 Cannabis dependence, uncomplicated: Secondary | ICD-10-CM | POA: Diagnosis not present

## 2022-07-15 DIAGNOSIS — F1420 Cocaine dependence, uncomplicated: Secondary | ICD-10-CM | POA: Diagnosis not present

## 2022-07-21 ENCOUNTER — Telehealth: Payer: Medicaid Other | Admitting: Family Medicine

## 2022-07-21 ENCOUNTER — Encounter: Payer: Medicaid Other | Admitting: Podiatry

## 2022-07-21 ENCOUNTER — Encounter: Payer: Self-pay | Admitting: Podiatry

## 2022-07-21 DIAGNOSIS — J019 Acute sinusitis, unspecified: Secondary | ICD-10-CM | POA: Diagnosis not present

## 2022-07-21 DIAGNOSIS — B9689 Other specified bacterial agents as the cause of diseases classified elsewhere: Secondary | ICD-10-CM | POA: Diagnosis not present

## 2022-07-21 MED ORDER — PSEUDOEPH-BROMPHEN-DM 30-2-10 MG/5ML PO SYRP: 5.0000 mL | ORAL_SOLUTION | Freq: Three times a day (TID) | ORAL | 0 refills | Status: AC | PRN

## 2022-07-21 MED ORDER — AMOXICILLIN-POT CLAVULANATE 875-125 MG PO TABS: 1.0000 | ORAL_TABLET | Freq: Two times a day (BID) | ORAL | 0 refills | Status: AC

## 2022-07-21 NOTE — Progress Notes (Signed)
Virtual Visit Consent   Christina Richards, you are scheduled for a virtual visit with a [facility] provider today. Just as with appointments in the office, your consent must be obtained to participate. Your consent will be active for this visit and any virtual visit you may have with one of our providers in the next 365 days. If you have a MyChart account, a copy of this consent can be sent to you electronically.  As this is a virtual visit, video technology does not allow for your provider to perform a traditional examination. This may limit your provider's ability to fully assess your condition. If your provider identifies any concerns that need to be evaluated in person or the need to arrange testing (such as labs, EKG, etc.), we will make arrangements to do so. Although advances in technology are sophisticated, we cannot ensure that it will always work on either your end or our end. If the connection with a video visit is poor, the visit may have to be switched to a telephone visit. With either a video or telephone visit, we are not always able to ensure that we have a secure connection.  By engaging in this virtual visit, you consent to the provision of healthcare and authorize for your insurance to be billed (if applicable) for the services provided during this visit. Depending on your insurance coverage, you may receive a charge related to this service.  I need to obtain your verbal consent now. Are you willing to proceed with your visit today? [name] has provided verbal consent on 07/21/2022 for a virtual visit (video or telephone). Perlie Mayo, NP  Date: 07/21/2022 11:53 AM  Virtual Visit via Video Note   I, Perlie Mayo, connected with  Christina Richards  ([id number], October 16, 1960) on 07/21/22 at 11:45 AM EDT by a video-enabled telemedicine application and verified that I am speaking with the correct person using two identifiers.  Location: Patient: Virtual Visit Location Patient:  Home Provider: Virtual Visit Location Provider: Home Office   I discussed the limitations of evaluation and management by telemedicine and the availability of in person appointments. The patient expressed understanding and agreed to proceed.    History of Present Illness: Christina Richards is a 62 y.o. who identifies as a female who was assigned female at birth, and is being seen today for sinus congestion.  Onset was 7-10 days ago with sinus congestion  Associated symptoms are headache, sinus congestion, cough, and ear popping "like fluid"  Modifying factors are several OTC medications Denies chest pain, shortness of breath, fevers, chills  Exposure to sick contacts- unknown COVID test: negative x 2      Problems:  Patient Active Problem List   Diagnosis Date Noted   Myofascial pain dysfunction syndrome 09/24/2021   Fibromyalgia syndrome 08/19/2021   Bilateral primary osteoarthritis of knee 08/19/2021   Obesity, morbid 05/19/2021   Depression, recurrent 11/01/2019   Encounter for screening mammogram for malignant neoplasm of breast 11/01/2019   Stress incontinence of urine 10/11/2019   Encounter for general adult medical examination with abnormal findings 06/28/2019   Chronic otitis externa of both ears 06/28/2019   Irritable bowel syndrome with both constipation and diarrhea 06/28/2019   Exposure to STD 06/28/2019   Routine cervical smear 06/28/2019   Dysuria 123456   Illicit drug use 123456   Hyperlipidemia 01/19/2019   Depression, major, single episode, moderate 01/19/2019   Cocaine dependence without complication 123XX123   Chronic low back  pain without sciatica 07/25/2017   Acute vaginitis 05/05/2017   Candidiasis of vulva and vagina 05/05/2017   Acute sinusitis, unspecified 05/05/2017   Essential (primary) hypertension 05/05/2017   Body mass index 45.0-49.9, adult 05/05/2017   Status post rotator cuff repair 07/17/2014   Complete tear of right rotator cuff  01/04/2014    Allergies:  Allergies  Allergen Reactions   Ibuprofen Nausea Only   Medications:  Current Outpatient Medications:    amoxicillin-clavulanate (AUGMENTIN) 875-125 MG tablet, Take 1 tablet by mouth 2 (two) times daily for 7 days., Disp: 14 tablet, Rfl: 0   brompheniramine-pseudoephedrine-DM 30-2-10 MG/5ML syrup, Take 5 mLs by mouth 3 (three) times daily as needed., Disp: 120 mL, Rfl: 0   atorvastatin (LIPITOR) 20 MG tablet, Take 20 mg by mouth at bedtime., Disp: , Rfl:    azelastine (OPTIVAR) 0.05 % ophthalmic solution, Place 1 drop into both eyes 2 (two) times daily. (Patient taking differently: Place 1 drop into both eyes 2 (two) times daily as needed (allergies).), Disp: 6 mL, Rfl: 0   diclofenac Sodium (VOLTAREN) 1 % GEL, Apply 1 Application topically 4 (four) times daily as needed (pain)., Disp: , Rfl:    diltiazem (TIAZAC) 360 MG 24 hr capsule, Take 1 capsule (360 mg total) by mouth daily. (Patient taking differently: Take 360 mg by mouth 3 (three) times a week.), Disp: 90 capsule, Rfl: 1   diphenhydrAMINE (BENADRYL) 25 MG tablet, Take 50 mg by mouth daily as needed for itching., Disp: , Rfl:    FLOVENT HFA 110 MCG/ACT inhaler, Inhale 2 puffs into the lungs 2 (two) times daily as needed (shortness of breath)., Disp: , Rfl:    guaiFENesin (MUCINEX) 600 MG 12 hr tablet, Take 600 mg by mouth daily., Disp: , Rfl:    Incontinence Supply Disposable (RA PADS FOR WOMEN) MISC, Use      Use 3-4 bed incontinence pads every time napping or sleeping, uses 8-10 in a day R32, Disp: , Rfl:    levocetirizine (XYZAL) 5 MG tablet, Take 1 tablet (5 mg total) by mouth every evening., Disp: 30 tablet, Rfl: 1   omeprazole (PRILOSEC) 20 MG capsule, Take 20 mg by mouth daily as needed (acid reflux)., Disp: , Rfl:    polyethylene glycol powder (GLYCOLAX/MIRALAX) 17 GM/SCOOP powder, Take 17 g by mouth daily., Disp: , Rfl:    pregabalin (LYRICA) 50 MG capsule, Take 1 capsule (50 mg total) by mouth 2 (two)  times daily. X 1 week, then 1 tab in Am and 2 tabs at night x 1 week, then 100 mg BID- for fibromyalgia. (Patient not taking: Reported on 06/23/2022), Disp: 120 capsule, Rfl: 5   PROVENTIL HFA 108 (90 Base) MCG/ACT inhaler, Inhale 2 puffs into the lungs every 4 (four) hours as needed., Disp: , Rfl:   Observations/Objective: Patient is well-developed, well-nourished in no acute distress.  Resting comfortably  at home.  Head is normocephalic, atraumatic.  No labored breathing.  Speech is clear and coherent with logical content.  Patient is alert and oriented at baseline.    Assessment and Plan: 1. Acute bacterial sinusitis - amoxicillin-clavulanate (AUGMENTIN) 875-125 MG tablet; Take 1 tablet by mouth 2 (two) times daily for 7 days.  Dispense: 14 tablet; Refill: 0 - brompheniramine-pseudoephedrine-DM 30-2-10 MG/5ML syrup; Take 5 mLs by mouth 3 (three) times daily as needed.  Dispense: 120 mL; Refill: 0  -Take meds as prescribed -Rest -Use a cool mist humidifier especially during the winter months when heat dries out the  air. - Use saline nose sprays frequently to help soothe nasal passages and promote drainage. -Saline irrigations of the nose can be very helpful if done frequently.             * 4X daily for 1 week*             * Use of a nettie pot can be helpful with this.  *Follow directions with this* *Boiled or distilled water only -stay hydrated by drinking plenty of fluids - Keep thermostat turn down low to prevent drying out sinuses - For any cough or congestion- robitussin DM or Delsym as needed - For fever or aches or pains- take tylenol   If you do not improve you will need a follow up visit in person.               Reviewed side effects, risks and benefits of medication.    Patient acknowledged agreement and understanding of the plan.   Past Medical, Surgical, Social History, Allergies, and Medications have been Reviewed.    Follow Up Instructions: I discussed the  assessment and treatment plan with the patient. The patient was provided an opportunity to ask questions and all were answered. The patient agreed with the plan and demonstrated an understanding of the instructions.  A copy of instructions were sent to the patient via MyChart unless otherwise noted below.     The patient was advised to call back or seek an in-person evaluation if the symptoms worsen or if the condition fails to improve as anticipated.  Time:  I spent 9 minutes with the patient via telehealth technology discussing the above problems/concerns.    Perlie Mayo, NP

## 2022-07-21 NOTE — Patient Instructions (Signed)
Syliva Overman, thank you for joining Perlie Mayo, NP for today's virtual visit.  While this provider is not your primary care provider (PCP), if your PCP is located in our provider database this encounter information will be shared with them immediately following your visit.   [facility] account gives you access to today's visit and all your visits, tests, and labs performed at [facility] " click here if you don't have a [facility] account or go to [email or web address]  Consent: (Patient) DARIENE HELLBERG provided verbal consent for this virtual visit at the beginning of the encounter.  Current Medications:  Current Outpatient Medications:    amoxicillin-clavulanate (AUGMENTIN) 875-125 MG tablet, Take 1 tablet by mouth 2 (two) times daily for 7 days., Disp: 14 tablet, Rfl: 0   brompheniramine-pseudoephedrine-DM 30-2-10 MG/5ML syrup, Take 5 mLs by mouth 3 (three) times daily as needed., Disp: 120 mL, Rfl: 0   atorvastatin (LIPITOR) 20 MG tablet, Take 20 mg by mouth at bedtime., Disp: , Rfl:    azelastine (OPTIVAR) 0.05 % ophthalmic solution, Place 1 drop into both eyes 2 (two) times daily. (Patient taking differently: Place 1 drop into both eyes 2 (two) times daily as needed (allergies).), Disp: 6 mL, Rfl: 0   diclofenac Sodium (VOLTAREN) 1 % GEL, Apply 1 Application topically 4 (four) times daily as needed (pain)., Disp: , Rfl:    diltiazem (TIAZAC) 360 MG 24 hr capsule, Take 1 capsule (360 mg total) by mouth daily. (Patient taking differently: Take 360 mg by mouth 3 (three) times a week.), Disp: 90 capsule, Rfl: 1   diphenhydrAMINE (BENADRYL) 25 MG tablet, Take 50 mg by mouth daily as needed for itching., Disp: , Rfl:    FLOVENT HFA 110 MCG/ACT inhaler, Inhale 2 puffs into the lungs 2 (two) times daily as needed (shortness of breath)., Disp: , Rfl:    guaiFENesin (MUCINEX) 600 MG 12 hr tablet, Take 600 mg by mouth daily., Disp: , Rfl:    Incontinence Supply  Disposable (RA PADS FOR WOMEN) MISC, Use      Use 3-4 bed incontinence pads every time napping or sleeping, uses 8-10 in a day R32, Disp: , Rfl:    levocetirizine (XYZAL) 5 MG tablet, Take 1 tablet (5 mg total) by mouth every evening., Disp: 30 tablet, Rfl: 1   omeprazole (PRILOSEC) 20 MG capsule, Take 20 mg by mouth daily as needed (acid reflux)., Disp: , Rfl:    polyethylene glycol powder (GLYCOLAX/MIRALAX) 17 GM/SCOOP powder, Take 17 g by mouth daily., Disp: , Rfl:    pregabalin (LYRICA) 50 MG capsule, Take 1 capsule (50 mg total) by mouth 2 (two) times daily. X 1 week, then 1 tab in Am and 2 tabs at night x 1 week, then 100 mg BID- for fibromyalgia. (Patient not taking: Reported on 06/23/2022), Disp: 120 capsule, Rfl: 5   PROVENTIL HFA 108 (90 Base) MCG/ACT inhaler, Inhale 2 puffs into the lungs every 4 (four) hours as needed., Disp: , Rfl:    Medications ordered in this encounter:  Meds ordered this encounter  Medications   amoxicillin-clavulanate (AUGMENTIN) 875-125 MG tablet    Sig: Take 1 tablet by mouth 2 (two) times daily for 7 days.    Dispense:  14 tablet    Refill:  0    Order Specific Question:   Supervising Provider    Answer:   Chase Picket [id number]   brompheniramine-pseudoephedrine-DM 30-2-10 MG/5ML syrup    Sig: Take 5  mLs by mouth 3 (three) times daily as needed.    Dispense:  120 mL    Refill:  0    Order Specific Question:   Supervising Provider    Answer:   Chase Picket [id number]     *If you need refills on other medications prior to your next appointment, please contact your pharmacy*  Follow-Up: Call back or seek an in-person evaluation if the symptoms worsen or if the condition fails to improve as anticipated.  [facility] [phone]  Other Instructions  Sinus Infection, Adult A sinus infection is soreness and swelling (inflammation) of your sinuses. Sinuses are hollow spaces in the bones around your face. They are located: Around  your eyes. In the middle of your forehead. Behind your nose. In your cheekbones. Your sinuses and nasal passages are lined with a fluid called mucus. Mucus drains out of your sinuses. Swelling can trap mucus in your sinuses. This lets germs (bacteria, virus, or fungus) grow, which leads to infection. Most of the time, this condition is caused by a virus. What are the causes? Allergies. Asthma. Germs. Things that block your nose or sinuses. Growths in the nose (nasal polyps). Chemicals or irritants in the air. A fungus. This is rare. What increases the risk? Having a weak body defense system (immune system). Doing a lot of swimming or diving. Using nasal sprays too much. Smoking. What are the signs or symptoms? The main symptoms of this condition are pain and a feeling of pressure around the sinuses. Other symptoms include: Stuffy nose (congestion). This may make it hard to breathe through your nose. Runny nose (drainage). Soreness, swelling, and warmth in the sinuses. A cough that may get worse at night. Being unable to smell and taste. Mucus that collects in the throat or the back of the nose (postnasal drip). This may cause a sore throat or bad breath. Being very tired (fatigued). A fever. How is this diagnosed? Your symptoms. Your medical history. A physical exam. Tests to find out if your condition is short-term (acute) or long-term (chronic). Your doctor may: Check your nose for growths (polyps). Check your sinuses using a tool that has a light on one end (endoscope). Check for allergies or germs. Do imaging tests, such as an MRI or CT scan. How is this treated? Treatment for this condition depends on the cause and whether it is short-term or long-term. If caused by a virus, your symptoms should go away on their own within 10 days. You may be given medicines to relieve symptoms. They include: Medicines that shrink swollen tissue in the nose. A spray that treats swelling  of the nostrils. Rinses that help get rid of thick mucus in your nose (nasal saline washes). Medicines that treat allergies (antihistamines). Over-the-counter pain relievers. If caused by bacteria, your doctor may wait to see if you will get better without treatment. You may be given antibiotic medicine if you have: A very bad infection. A weak body defense system. If caused by growths in the nose, surgery may be needed. Follow these instructions at home: Medicines Take, use, or apply over-the-counter and prescription medicines only as told by your doctor. These may include nasal sprays. If you were prescribed an antibiotic medicine, take it as told by your doctor. Do not stop taking it even if you start to feel better. Hydrate and humidify  Drink enough water to keep your pee (urine) pale yellow. Use a cool mist humidifier to keep the humidity  level in your home above 50%. Breathe in steam for 10-15 minutes, 3-4 times a day, or as told by your doctor. You can do this in the bathroom while a hot shower is running. Try not to spend time in cool or dry air. Rest Rest as much as you can. Sleep with your head raised (elevated). Make sure you get enough sleep each night. General instructions  Put a warm, moist washcloth on your face 3-4 times a day, or as often as told by your doctor. Use nasal saline washes as often as told by your doctor. Wash your hands often with soap and water. If you cannot use soap and water, use hand sanitizer. Do not smoke. Avoid being around people who are smoking (secondhand smoke). Keep all follow-up visits. Contact a doctor if: You have a fever. Your symptoms get worse. Your symptoms do not get better within 10 days. Get help right away if: You have a very bad headache. You cannot stop vomiting. You have very bad pain or swelling around your face or eyes. You have trouble seeing. You feel confused. Your neck is stiff. You have trouble breathing. These  symptoms may be an emergency. Get help right away. Call 911. Do not wait to see if the symptoms will go away. Do not drive yourself to the hospital. Summary A sinus infection is swelling of your sinuses. Sinuses are hollow spaces in the bones around your face. This condition is caused by tissues in your nose that become inflamed or swollen. This traps germs. These can lead to infection. If you were prescribed an antibiotic medicine, take it as told by your doctor. Do not stop taking it even if you start to feel better. Keep all follow-up visits. This information is not intended to replace advice given to you by your health care provider. Make sure you discuss any questions you have with your health care provider. Document Revised: 03/11/2021 Document Reviewed: 03/11/2021 Elsevier Patient Education  [place].   If you have been instructed to have an in-person evaluation today at a local Urgent Care facility, please use the link below. It will take you to a list of all of our available [facility], including address, phone number and hours of operation. Please do not delay care.  [facility]  If you or a family member do not have a primary care provider, use the link below to schedule a visit and establish care. When you choose a [facility] primary care physician or advanced practice provider, you gain a long-term partner in health. Find a Primary Care Provider  Learn more about [facility] in-office and virtual care options: [facility]

## 2022-07-22 ENCOUNTER — Encounter: Payer: Medicaid Other | Admitting: Podiatry

## 2022-07-23 ENCOUNTER — Encounter: Payer: Medicaid Other | Admitting: Podiatry

## 2022-07-23 DIAGNOSIS — Z1389 Encounter for screening for other disorder: Secondary | ICD-10-CM | POA: Diagnosis not present

## 2022-07-23 DIAGNOSIS — Z0131 Encounter for examination of blood pressure with abnormal findings: Secondary | ICD-10-CM | POA: Diagnosis not present

## 2022-07-23 DIAGNOSIS — R0602 Shortness of breath: Secondary | ICD-10-CM | POA: Diagnosis not present

## 2022-07-23 DIAGNOSIS — J45998 Other asthma: Secondary | ICD-10-CM | POA: Diagnosis not present

## 2022-07-29 DIAGNOSIS — F1420 Cocaine dependence, uncomplicated: Secondary | ICD-10-CM | POA: Diagnosis not present

## 2022-07-29 DIAGNOSIS — F1220 Cannabis dependence, uncomplicated: Secondary | ICD-10-CM | POA: Diagnosis not present

## 2022-08-07 ENCOUNTER — Encounter: Payer: Self-pay | Admitting: Cardiology

## 2022-08-07 ENCOUNTER — Ambulatory Visit: Payer: Medicaid Other | Attending: Cardiology | Admitting: Cardiology

## 2022-08-07 VITALS — BP 124/84 | HR 74 | Ht 64.0 in | Wt 298.2 lb

## 2022-08-07 DIAGNOSIS — R0602 Shortness of breath: Secondary | ICD-10-CM | POA: Insufficient documentation

## 2022-08-07 DIAGNOSIS — F17200 Nicotine dependence, unspecified, uncomplicated: Secondary | ICD-10-CM | POA: Diagnosis not present

## 2022-08-07 DIAGNOSIS — Z01810 Encounter for preprocedural cardiovascular examination: Secondary | ICD-10-CM | POA: Diagnosis not present

## 2022-08-07 DIAGNOSIS — I10 Essential (primary) hypertension: Secondary | ICD-10-CM | POA: Diagnosis not present

## 2022-08-07 NOTE — Progress Notes (Signed)
Cardiology Office Note:    Date:  08/07/2022   ID:  [name], DOB [birth year], MRN [id number]  PCP:  [facility] Providers Cardiologist:  Debbe Odea, MD     Referring MD: Emogene Morgan, MD   Chief Complaint  Patient presents with   Follow-up    Patient denies new or acute cardiac problems/concerns today other than increasing exertional SOBr.  Need to have cardiology clearance for toe surgery (schedule depending on clearance).      History of Present Illness:    Christina Richards is a 62 y.o. female with a hx of hypertension, hyperlipidemia, current smoker, morbid obesity who presents for preprocedural exam.    Patient has a right toe pain, toe surgery is being planned.  She endorses shortness of breath with exertion, denies chest pain.  She is a current smoker.  BP adequately controlled.  States having a feeling of food being stuck in her stomach, has an appointment with gastroenterology upcoming.   Past Medical History:  Diagnosis Date   Allergy    Environmental   Anemia    Anxiety    Arthritis    Bipolar disorder    Cocaine abuse    smokes crack   Coronary artery disease    Depression    GERD (gastroesophageal reflux disease)    History of borderline personality disorder    History of rotator cuff surgery 05/09/2017   right shoulder   Hypercholesteremia    Hypertension    Sleep apnea    does not use CPAP    Past Surgical History:  Procedure Laterality Date   ECTOPIC PREGNANCY SURGERY     HAND SURGERY Right    thumb fusion   RESECTION DISTAL CLAVICAL  09/26/2020   Procedure: ARTHROSCOPIC RESECTION DISTAL CLAVICAL;  Surgeon: Bjorn Pippin, MD;  Location: [facility];  Service: Orthopedics;;   ROTATOR CUFF REPAIR     SHOULDER ARTHROSCOPY WITH ROTATOR CUFF REPAIR AND SUBACROMIAL DECOMPRESSION Right 09/26/2020   Procedure: SHOULDER ARTHROSCOPY DEBRIDEMENT WITH SUBACROMIAL DECOMPRESSION AND DISTAL CLAVICLE  EXCISION AND ARTHROSCOPIC REVISION ROTATOR CUFF REPAIR;  Surgeon: Bjorn Pippin, MD;  Location: [facility];  Service: Orthopedics;  Laterality: Right;    Current Medications: Current Meds  Medication Sig   atorvastatin (LIPITOR) 20 MG tablet Take 20 mg by mouth at bedtime.   azelastine (OPTIVAR) 0.05 % ophthalmic solution Place 1 drop into both eyes 2 (two) times daily. (Patient taking differently: Place 1 drop into both eyes 2 (two) times daily as needed (allergies).)   brompheniramine-pseudoephedrine-DM 30-2-10 MG/5ML syrup Take 5 mLs by mouth 3 (three) times daily as needed.   diclofenac Sodium (VOLTAREN) 1 % GEL Apply 1 Application topically 4 (four) times daily as needed (pain).   diltiazem (TIAZAC) 360 MG 24 hr capsule Take 1 capsule (360 mg total) by mouth daily.   diphenhydrAMINE (BENADRYL) 25 MG tablet Take 50 mg by mouth daily as needed for itching.   guaiFENesin (MUCINEX) 600 MG 12 hr tablet Take 600 mg by mouth daily.   Incontinence Supply Disposable (RA PADS FOR WOMEN) MISC Use      Use 3-4 bed incontinence pads every time napping or sleeping, uses 8-10 in a day R32   omeprazole (PRILOSEC) 20 MG capsule Take 20 mg by mouth daily as needed (acid reflux).   polyethylene glycol powder (GLYCOLAX/MIRALAX) 17 GM/SCOOP powder Take 17 g by mouth daily.   PROVENTIL HFA 108 (  90 Base) MCG/ACT inhaler Inhale 2 puffs into the lungs every 4 (four) hours as needed.   SYMBICORT 160-4.5 MCG/ACT inhaler Inhale 2 puffs into the lungs 2 (two) times daily.     Allergies:   Ibuprofen   Social History   Socioeconomic History   Marital status: Single    Spouse name: Not on file   Number of children: 1   Years of education: Not on file   Highest education level: 10th grade  Occupational History   Not on file  Tobacco Use   Smoking status: Every Day    Types: Cigars   Smokeless tobacco: Never   Tobacco comments:    Smoked cigarettes x 10 year 1 pack daily  Vaping Use   Vaping  Use: Never used  Substance and Sexual Activity   Alcohol use: No   Drug use: Not Currently    Types: "Crack" cocaine    Comment: 09/18/2020   Sexual activity: Never    Birth control/protection: Post-menopausal  Other Topics Concern   Not on file  Social History Narrative   Not on file   Social Determinants of Health   Financial Resource Strain: Low Risk  (02/21/2019)   Overall Financial Resource Strain (CARDIA)    Difficulty of Paying Living Expenses: Not very hard  Food Insecurity: Food Insecurity Present (02/21/2019)   Hunger Vital Sign    Worried About Running Out of Food in the Last Year: Sometimes true    Ran Out of Food in the Last Year: Sometimes true  Transportation Needs: Unmet Transportation Needs (02/21/2019)   PRAPARE - Transportation    Lack of Transportation (Medical): Yes    Lack of Transportation (Non-Medical): Yes  Physical Activity: Inactive (02/21/2019)   Exercise Vital Sign    Days of Exercise per Week: 0 days    Minutes of Exercise per Session: 0 min  Stress: Stress Concern Present (02/21/2019)   [employer] of Occupational Health - Occupational Stress Questionnaire    Feeling of Stress : Very much  Social Connections: Unknown (02/21/2019)   Social Connection and Isolation Panel [NHANES]    Frequency of Communication with Friends and Family: Not on file    Frequency of Social Gatherings with Friends and Family: Not on file    Attends Religious Services: Never    Database administrator or Organizations: No    Attends Banker Meetings: Never    Marital Status: Never married     Family History: The patient's Family history is unknown by patient.  ROS:   Please see the history of present illness.     All other systems reviewed and are negative.  EKGs/Labs/Other Studies Reviewed:    The following studies were reviewed today:   EKG:  EKG is  ordered today.  The ekg ordered today demonstrates sinus rhythm, marked sinus arrhythmia,  PACs.  Recent Labs: No results found for requested labs within last 365 days.  Recent Lipid Panel    Component Value Date/Time   CHOL 230 (H) 12/06/2018 1134   TRIG 140 12/06/2018 1134   HDL 67 12/06/2018 1134   LDLCALC 135 (H) 12/06/2018 1134     Risk Assessment/Calculations:          Physical Exam:    VS:  BP 124/84 (BP Location: Left Arm, Patient Position: Sitting, Cuff Size: Large)   Pulse 74   Ht 5\' 4"  (1.626 m)   Wt 298 lb 3.2 oz (135.3 kg)   LMP 12/08/2012 Comment: no  periods since" I was 39"  SpO2 97%   BMI 51.19 kg/m     Wt Readings from Last 3 Encounters:  08/07/22 298 lb 3.2 oz (135.3 kg)  09/24/21 299 lb 3.2 oz (135.7 kg)  08/19/21 (!) 303 lb (137.4 kg)     GEN:  Well nourished, well developed in no acute distress HEENT: Normal NECK: No JVD; No carotid bruits CARDIAC: Irregular rhythm, no murmurs RESPIRATORY:  Clear to auscultation without rales, wheezing or rhonchi  ABDOMEN: Soft, non-tender, non-distended MUSCULOSKELETAL:  No edema; No deformity  SKIN: Warm and dry NEUROLOGIC:  Alert and oriented x 3 PSYCHIATRIC:  Normal affect   ASSESSMENT:    1. Pre-procedural cardiovascular examination   2. Shortness of breath   3. Primary hypertension   4. Obesity, morbid   5. Smoking    PLAN:    In order of problems listed above:  Preop evaluation, close surgery being planned.  Dyspnea on exertion.  Get echocardiogram to rule out any structural abnormalities.  Shortness of breath likely from chronic smoking and morbid obesity.  She denies chest pain.  Okay for procedure if no significant structural abnormalities noted on echo Hypertension, BP controlled, continue diltiazem. Morbid obesity, weight loss, low-calorie diet advised. Current smoker, cessation advised.  Follow-up after echocardiogram or as needed if echo is normal.     Medication Adjustments/Labs and Tests Ordered: Current medicines are reviewed at length with the patient today.   Concerns regarding medicines are outlined above.  Orders Placed This Encounter  Procedures   EKG 12-Lead   ECHOCARDIOGRAM COMPLETE   No orders of the defined types were placed in this encounter.   Patient Instructions  Medication Instructions:   Your physician recommends that you continue on your current medications as directed. Please refer to the Current Medication list given to you today.   *If you need a refill on your cardiac medications before your next appointment, please call your pharmacy*   Lab Work:  None Ordered  If you have labs (blood work) drawn today and your tests are completely normal, you will receive your results only by: MyChart Message (if you have MyChart) OR A paper copy in the mail If you have any lab test that is abnormal or we need to change your treatment, we will call you to review the results.   Testing/Procedures:  Your physician has requested that you have an echocardiogram. Echocardiography is a painless test that uses sound waves to create images of your heart. It provides your doctor with information about the size and shape of your heart and how well your heart's chambers and valves are working. This procedure takes approximately one hour. There are no restrictions for this procedure. Please do NOT wear cologne, perfume, aftershave, or lotions (deodorant is allowed). Please arrive 15 minutes prior to your appointment time.    Follow-Up: At [facility], you and your health needs are our priority.  As part of our continuing mission to provide you with exceptional heart care, we have created designated Provider Care Teams.  These Care Teams include your primary Cardiologist (physician) and Advanced Practice Providers (APPs -  Physician Assistants and Nurse Practitioners) who all work together to provide you with the care you need, when you need it.  We recommend signing up for the patient portal called "MyChart".  Sign up information  is provided on this After Visit Summary.  MyChart is used to connect with patients for Virtual Visits (Telemedicine).  Patients are able to  view lab/test results, encounter notes, upcoming appointments, etc.  Non-urgent messages can be sent to your provider as well.   To learn more about what you can do with MyChart, go to [email or web address].    Your next appointment:    After Echocardiogram  Provider:   You may see Debbe Odea, MD or one of the following Advanced Practice Providers on your designated Care Team:   Nicolasa Ducking, NP Eula Listen, PA-C Cadence Fransico Michael, PA-C Charlsie Quest, NP   Signed, Debbe Odea, MD  08/07/2022 2:52 PM    [facility]

## 2022-08-07 NOTE — Patient Instructions (Signed)
Medication Instructions:   Your physician recommends that you continue on your current medications as directed. Please refer to the Current Medication list given to you today.  *If you need a refill on your cardiac medications before your next appointment, please call your pharmacy*   Lab Work:  None Ordered  If you have labs (blood work) drawn today and your tests are completely normal, you will receive your results only by: MyChart Message (if you have MyChart) OR A paper copy in the mail If you have any lab test that is abnormal or we need to change your treatment, we will call you to review the results.   Testing/Procedures:  Your physician has requested that you have an echocardiogram. Echocardiography is a painless test that uses sound waves to create images of your heart. It provides your doctor with information about the size and shape of your heart and how well your heart's chambers and valves are working. This procedure takes approximately one hour. There are no restrictions for this procedure. Please do NOT wear cologne, perfume, aftershave, or lotions (deodorant is allowed). Please arrive 15 minutes prior to your appointment time.    Follow-Up: At [facility], you and your health needs are our priority.  As part of our continuing mission to provide you with exceptional heart care, we have created designated Provider Care Teams.  These Care Teams include your primary Cardiologist (physician) and Advanced Practice Providers (APPs -  Physician Assistants and Nurse Practitioners) who all work together to provide you with the care you need, when you need it.  We recommend signing up for the patient portal called "MyChart".  Sign up information is provided on this After Visit Summary.  MyChart is used to connect with patients for Virtual Visits (Telemedicine).  Patients are able to view lab/test results, encounter notes, upcoming appointments, etc.  Non-urgent messages can  be sent to your provider as well.   To learn more about what you can do with MyChart, go to [email or web address].    Your next appointment:    After Echocardiogram  Provider:   You may see Brian Agbor-Etang, MD or one of the following Advanced Practice Providers on your designated Care Team:   Christopher Berge, NP Ryan Dunn, PA-C Cadence Furth, PA-C Sheri Hammock, NP 

## 2022-08-10 ENCOUNTER — Other Ambulatory Visit: Payer: Medicaid Other

## 2022-08-17 ENCOUNTER — Encounter
Payer: Medicaid Other | Attending: Physical Medicine and Rehabilitation | Admitting: Physical Medicine and Rehabilitation

## 2022-08-20 ENCOUNTER — Ambulatory Visit: Payer: Medicaid Other | Admitting: Medical

## 2022-08-26 ENCOUNTER — Encounter: Payer: 59 | Admitting: Physical Medicine and Rehabilitation

## 2022-09-07 ENCOUNTER — Ambulatory Visit: Payer: 59 | Attending: Cardiology

## 2022-09-07 DIAGNOSIS — R0602 Shortness of breath: Secondary | ICD-10-CM

## 2022-09-07 LAB — ECHOCARDIOGRAM COMPLETE
AV Mean grad: 4 mmHg
AV Peak grad: 8.2 mmHg
Ao pk vel: 1.43 m/s
Area-P 1/2: 3.91 cm2
Calc EF: 77.3 %
S' Lateral: 2.5 cm
Single Plane A2C EF: 75.3 %
Single Plane A4C EF: 78.3 %

## 2022-09-07 MED ORDER — PERFLUTREN LIPID MICROSPHERE
1.0000 mL | INTRAVENOUS | Status: AC | PRN
  Administered 2022-09-07: 2 mL via INTRAVENOUS

## 2022-09-11 DIAGNOSIS — R32 Unspecified urinary incontinence: Secondary | ICD-10-CM | POA: Diagnosis not present

## 2022-09-11 DIAGNOSIS — F251 Schizoaffective disorder, depressive type: Secondary | ICD-10-CM | POA: Diagnosis not present

## 2022-09-21 ENCOUNTER — Ambulatory Visit: Payer: 59 | Admitting: Cardiology

## 2022-10-23 ENCOUNTER — Encounter: Payer: 59 | Attending: Physical Medicine and Rehabilitation | Admitting: Physical Medicine and Rehabilitation

## 2022-11-04 ENCOUNTER — Ambulatory Visit: Payer: 59 | Admitting: Cardiology

## 2022-11-10 ENCOUNTER — Encounter: Payer: Self-pay | Admitting: Cardiology

## 2022-11-18 ENCOUNTER — Encounter: Payer: Self-pay | Admitting: Internal Medicine

## 2022-11-24 ENCOUNTER — Encounter: Payer: Self-pay | Admitting: Internal Medicine

## 2022-12-24 DIAGNOSIS — R32 Unspecified urinary incontinence: Secondary | ICD-10-CM | POA: Diagnosis not present

## 2022-12-24 DIAGNOSIS — F251 Schizoaffective disorder, depressive type: Secondary | ICD-10-CM | POA: Diagnosis not present

## 2023-01-12 ENCOUNTER — Ambulatory Visit: Admission: RE | Admit: 2023-01-12 | Payer: 59 | Source: Home / Self Care | Admitting: Internal Medicine

## 2023-01-12 ENCOUNTER — Encounter: Admission: RE | Payer: Self-pay | Source: Home / Self Care

## 2023-01-12 SURGERY — COLONOSCOPY WITH PROPOFOL
Anesthesia: General

## 2023-01-14 ENCOUNTER — Ambulatory Visit (INDEPENDENT_AMBULATORY_CARE_PROVIDER_SITE_OTHER): Payer: Medicaid Other | Admitting: Podiatry

## 2023-01-14 DIAGNOSIS — M76822 Posterior tibial tendinitis, left leg: Secondary | ICD-10-CM

## 2023-01-14 DIAGNOSIS — M76821 Posterior tibial tendinitis, right leg: Secondary | ICD-10-CM

## 2023-01-14 NOTE — Progress Notes (Signed)
Subjective:  Patient ID: Christina Richards, female    DOB: December 16, 1960,  MRN: [id number]  Chief Complaint  Patient presents with   Arthritis    Pt stated that she is having pain in her feet     62 y.o. female presents with the above complaint.  Patient presents with complaint bilateral posterior tibial tendinitis left greater than right side.  Patient states has been present for quite some time is progressive gotten worse she just came out of nowhere.  She is having pain in both feet.  She would like to discuss treatment options with both about the same left is slightly worse than right side.  Pain scale 7 out of 10 dull aching nature   Review of Systems: Negative except as noted in the HPI. Denies N/V/F/Ch.  Past Medical History:  Diagnosis Date   Allergy    Environmental   Anemia    Anxiety    Arthritis    Bipolar disorder (HCC)    Cocaine abuse (HCC)    smokes crack   Coronary artery disease    Depression    GERD (gastroesophageal reflux disease)    History of borderline personality disorder    History of rotator cuff surgery 05/09/2017   right shoulder   Hypercholesteremia    Hypertension    Sleep apnea    does not use CPAP    Current Outpatient Medications:    atorvastatin (LIPITOR) 20 MG tablet, Take 20 mg by mouth at bedtime., Disp: , Rfl:    azelastine (OPTIVAR) 0.05 % ophthalmic solution, Place 1 drop into both eyes 2 (two) times daily. (Patient taking differently: Place 1 drop into both eyes 2 (two) times daily as needed (allergies).), Disp: 6 mL, Rfl: 0   brompheniramine-pseudoephedrine-DM 30-2-10 MG/5ML syrup, Take 5 mLs by mouth 3 (three) times daily as needed., Disp: 120 mL, Rfl: 0   diclofenac Sodium (VOLTAREN) 1 % GEL, Apply 1 Application topically 4 (four) times daily as needed (pain)., Disp: , Rfl:    diltiazem (TIAZAC) 360 MG 24 hr capsule, Take 1 capsule (360 mg total) by mouth daily., Disp: 90 capsule, Rfl: 1   diphenhydrAMINE (BENADRYL) 25 MG tablet, Take 50  mg by mouth daily as needed for itching., Disp: , Rfl:    FLOVENT HFA 110 MCG/ACT inhaler, Inhale 2 puffs into the lungs 2 (two) times daily as needed (shortness of breath). (Patient not taking: Reported on 08/07/2022), Disp: , Rfl:    guaiFENesin (MUCINEX) 600 MG 12 hr tablet, Take 600 mg by mouth daily., Disp: , Rfl:    Incontinence Supply Disposable (RA PADS FOR WOMEN) MISC, Use      Use 3-4 bed incontinence pads every time napping or sleeping, uses 8-10 in a day R32, Disp: , Rfl:    levocetirizine (XYZAL) 5 MG tablet, Take 1 tablet (5 mg total) by mouth every evening. (Patient not taking: Reported on 08/07/2022), Disp: 30 tablet, Rfl: 1   omeprazole (PRILOSEC) 20 MG capsule, Take 20 mg by mouth daily as needed (acid reflux)., Disp: , Rfl:    polyethylene glycol powder (GLYCOLAX/MIRALAX) 17 GM/SCOOP powder, Take 17 g by mouth daily., Disp: , Rfl:    pregabalin (LYRICA) 50 MG capsule, Take 1 capsule (50 mg total) by mouth 2 (two) times daily. X 1 week, then 1 tab in Am and 2 tabs at night x 1 week, then 100 mg BID- for fibromyalgia. (Patient not taking: Reported on 06/23/2022), Disp: 120 capsule, Rfl: 5   PROVENTIL HFA  108 (90 Base) MCG/ACT inhaler, Inhale 2 puffs into the lungs every 4 (four) hours as needed., Disp: , Rfl:    SYMBICORT 160-4.5 MCG/ACT inhaler, Inhale 2 puffs into the lungs 2 (two) times daily. (Patient not taking: Reported on 11/18/2022), Disp: , Rfl:   Social History   Tobacco Use  Smoking Status Every Day   Types: Cigars  Smokeless Tobacco Never  Tobacco Comments   Smoked cigarettes x 10 year 1 pack daily    Allergies  Allergen Reactions   Ibuprofen Nausea Only   Objective:  There were no vitals filed for this visit. There is no height or weight on file to calculate BMI. Constitutional Well developed. Well nourished.  Vascular Dorsalis pedis pulses palpable bilaterally. Posterior tibial pulses palpable bilaterally. Capillary refill normal to all digits.  No cyanosis  or clubbing noted. Pedal hair growth normal.  Neurologic Normal speech. Oriented to person, place, and time. Epicritic sensation to light touch grossly present bilaterally.  Dermatologic Nails well groomed and normal in appearance. No open wounds. No skin lesions.  Orthopedic: Pain along the course of the posterior tibial tendon including some at the insertion pain with resisted plantarflexion inversion of the foot no pain with dorsiflexion eversion of the foot.  Left greater than right side.  No pain at the Achilles tendon peroneal tendon ATFL ligament   Radiographs: None Assessment:   1. Posterior tibial tendinitis, left   2. Posterior tibial tendinitis, right    Plan:  Patient was evaluated and treated and all questions answered.  Bilateral posterior tibial tendinitis left greater than right side -All questions and concerns were discussed with the patient in extensive detail given the amount of pain that she is having she will benefit from cam boot immobilization to the left side a Tri-Lock brace to the right side.  Both of which were dispensed.  In 4 weeks she will switch between 2 sides where cam boot will go on the right side a Tri-Lock ankle brace for the left side.  She states understanding will do so -If no improvement we will discuss MRI  No follow-ups on file.

## 2023-03-16 ENCOUNTER — Ambulatory Visit: Payer: 59 | Admitting: Podiatry

## 2023-04-02 ENCOUNTER — Other Ambulatory Visit: Payer: Self-pay

## 2023-04-02 ENCOUNTER — Inpatient Hospital Stay
Admission: EM | Admit: 2023-04-02 | Discharge: 2023-04-05 | DRG: 202 | Disposition: A | Discharge status: Home / Self Care | Payer: MEDICAID | Attending: Obstetrics and Gynecology | Admitting: Obstetrics and Gynecology

## 2023-04-02 ENCOUNTER — Emergency Department: Payer: MEDICAID

## 2023-04-02 DIAGNOSIS — F319 Bipolar disorder, unspecified: Secondary | ICD-10-CM | POA: Diagnosis present

## 2023-04-02 DIAGNOSIS — K219 Gastro-esophageal reflux disease without esophagitis: Secondary | ICD-10-CM | POA: Insufficient documentation

## 2023-04-02 DIAGNOSIS — J209 Acute bronchitis, unspecified: Principal | ICD-10-CM | POA: Diagnosis present

## 2023-04-02 DIAGNOSIS — Z7982 Long term (current) use of aspirin: Secondary | ICD-10-CM

## 2023-04-02 DIAGNOSIS — H9201 Otalgia, right ear: Secondary | ICD-10-CM | POA: Diagnosis present

## 2023-04-02 DIAGNOSIS — G4730 Sleep apnea, unspecified: Secondary | ICD-10-CM | POA: Diagnosis present

## 2023-04-02 DIAGNOSIS — F17290 Nicotine dependence, other tobacco product, uncomplicated: Secondary | ICD-10-CM | POA: Diagnosis present

## 2023-04-02 DIAGNOSIS — E7800 Pure hypercholesterolemia, unspecified: Secondary | ICD-10-CM | POA: Diagnosis present

## 2023-04-02 DIAGNOSIS — Z5900 Homelessness unspecified: Secondary | ICD-10-CM

## 2023-04-02 DIAGNOSIS — E66813 Obesity, class 3: Secondary | ICD-10-CM | POA: Diagnosis present

## 2023-04-02 DIAGNOSIS — R079 Chest pain, unspecified: Secondary | ICD-10-CM

## 2023-04-02 DIAGNOSIS — Z7951 Long term (current) use of inhaled steroids: Secondary | ICD-10-CM

## 2023-04-02 DIAGNOSIS — E785 Hyperlipidemia, unspecified: Secondary | ICD-10-CM | POA: Insufficient documentation

## 2023-04-02 DIAGNOSIS — R0902 Hypoxemia: Secondary | ICD-10-CM

## 2023-04-02 DIAGNOSIS — E876 Hypokalemia: Secondary | ICD-10-CM

## 2023-04-02 DIAGNOSIS — I2510 Atherosclerotic heart disease of native coronary artery without angina pectoris: Secondary | ICD-10-CM | POA: Diagnosis present

## 2023-04-02 DIAGNOSIS — Z6841 Body Mass Index (BMI) 40.0 and over, adult: Secondary | ICD-10-CM

## 2023-04-02 DIAGNOSIS — I10 Essential (primary) hypertension: Secondary | ICD-10-CM | POA: Insufficient documentation

## 2023-04-02 DIAGNOSIS — Z823 Family history of stroke: Secondary | ICD-10-CM

## 2023-04-02 DIAGNOSIS — Z888 Allergy status to other drugs, medicaments and biological substances status: Secondary | ICD-10-CM

## 2023-04-02 DIAGNOSIS — Z79899 Other long term (current) drug therapy: Secondary | ICD-10-CM

## 2023-04-02 DIAGNOSIS — M797 Fibromyalgia: Secondary | ICD-10-CM | POA: Diagnosis present

## 2023-04-02 DIAGNOSIS — J9601 Acute respiratory failure with hypoxia: Secondary | ICD-10-CM | POA: Diagnosis present

## 2023-04-02 DIAGNOSIS — E878 Other disorders of electrolyte and fluid balance, not elsewhere classified: Secondary | ICD-10-CM | POA: Diagnosis present

## 2023-04-02 DIAGNOSIS — Z800 Family history of malignant neoplasm of digestive organs: Secondary | ICD-10-CM

## 2023-04-02 DIAGNOSIS — F603 Borderline personality disorder: Secondary | ICD-10-CM | POA: Diagnosis present

## 2023-04-02 LAB — CBC WITH DIFFERENTIAL/PLATELET
Abs Immature Granulocytes: 0.02 10*3/uL (ref 0.00–0.07)
Basophils Absolute: 0 10*3/uL (ref 0.0–0.1)
Basophils Relative: 0 %
Eosinophils Absolute: 0 10*3/uL (ref 0.0–0.5)
Eosinophils Relative: 0 %
HCT: 41.1 % (ref 36.0–46.0)
Hemoglobin: 13.6 g/dL (ref 12.0–15.0)
Immature Granulocytes: 1 %
Lymphocytes Relative: 22 %
Lymphs Abs: 0.8 10*3/uL (ref 0.7–4.0)
MCH: 28.8 pg (ref 26.0–34.0)
MCHC: 33.1 g/dL (ref 30.0–36.0)
MCV: 86.9 fL (ref 80.0–100.0)
Monocytes Absolute: 0.3 10*3/uL (ref 0.1–1.0)
Monocytes Relative: 8 %
Neutro Abs: 2.7 10*3/uL (ref 1.7–7.7)
Neutrophils Relative %: 69 %
Platelets: 205 10*3/uL (ref 150–400)
RBC: 4.73 MIL/uL (ref 3.87–5.11)
RDW: 14.3 % (ref 11.5–15.5)
WBC: 3.8 10*3/uL — ABNORMAL LOW (ref 4.0–10.5)
nRBC: 0 % (ref 0.0–0.2)

## 2023-04-02 LAB — RESP PANEL BY RT-PCR (RSV, FLU A&B, COVID)  RVPGX2
Influenza A by PCR: NEGATIVE
Influenza B by PCR: NEGATIVE
Resp Syncytial Virus by PCR: NEGATIVE
SARS Coronavirus 2 by RT PCR: NEGATIVE

## 2023-04-02 LAB — D-DIMER, QUANTITATIVE: D-Dimer, Quant: 0.63 ug{FEU}/mL — ABNORMAL HIGH (ref 0.00–0.50)

## 2023-04-02 MED ORDER — PREDNISONE 20 MG PO TABS
60.0000 mg | ORAL_TABLET | Freq: Once | ORAL | Status: AC
  Administered 2023-04-02: 60 mg via ORAL
  Filled 2023-04-02: qty 3

## 2023-04-02 MED ORDER — IPRATROPIUM-ALBUTEROL 0.5-2.5 (3) MG/3ML IN SOLN
3.0000 mL | Freq: Once | RESPIRATORY_TRACT | Status: AC
  Administered 2023-04-02: 3 mL via RESPIRATORY_TRACT
  Filled 2023-04-02: qty 3

## 2023-04-02 NOTE — ED Triage Notes (Signed)
Pt comes with c/o cough and cold symptoms for few days. Pt states right ear pain. Pt states no relief with Otc meds. Pt states hx of URI and bronchitis.

## 2023-04-02 NOTE — ED Provider Triage Note (Signed)
Emergency Medicine Provider Triage Evaluation Note  Christina Richards , a 62 y.o. female  was evaluated in triage.  Pt complains of productive cough and right ear pain for 3-4 days. Endorses sick contacts and is homeless at this time.   Review of Systems  Positive:  Negative:   Physical Exam  LMP 12/08/2012 Comment: no periods since" I was 39" Gen:   Awake, no distress   Resp:  Normal effort LCTAB MSK:   Moves extremities without difficulty  Other:    Medical Decision Making  Medically screening exam initiated at 4:38 PM.  Appropriate orders placed.  Christina Richards was informed that the remainder of the evaluation will be completed by another provider, this initial triage assessment does not replace that evaluation, and the importance of remaining in the ED until their evaluation is complete.     Kern Reap A, PA-C 04/02/23 1642

## 2023-04-02 NOTE — ED Provider Notes (Signed)
[facility] Emergency Department Provider Note     Event Date/Time   First MD Initiated Contact with Patient 04/02/23 2122     (approximate)   History   Cough   HPI  Christina Richards is a 62 y.o. female with a history of HTN, homelessness and CAD presents to the ED for evaluation of cough and right ear pain x 4 days.  Has tried NyQuil and Alka-Seltzer with no relief.  Denies fever and vomiting.  Patient reports she has a history of URI and bronchitis and todays symptoms are similar.  Associated symptoms includes headache and dry throat.     Physical Exam   Triage Vital Signs: ED Triage Vitals  Encounter Vitals Group     BP 04/02/23 1640 (!) 184/92     Systolic BP Percentile --      Diastolic BP Percentile --      Pulse Rate 04/02/23 1640 74     Resp 04/02/23 1640 19     Temp 04/02/23 1640 99.3 F (37.4 C)     Temp src --      SpO2 04/02/23 1640 95 %     Weight 04/02/23 1638 289 lb (131.1 kg)     Height 04/02/23 1638 5\' 8"  (1.727 m)     Head Circumference --      Peak Flow --      Pain Score 04/02/23 1638 4     Pain Loc --      Pain Education --      Exclude from Growth Chart --     Most recent vital signs: Vitals:   04/02/23 1640  BP: (!) 184/92  Pulse: 74  Resp: 19  Temp: 99.3 F (37.4 C)  SpO2: 95%    General: Alert and oriented. INAD.     Head:  NCAT.  Eyes:  PERRLA. EOMI.  Ears:  EACs patent.  Right tympanic membranes reveals serous fluid. No bulging, erythema or discharge.  Nose:   Mucosa is moist. No rhinorrhea. Throat: Oropharynx clear. No erythema or exudates. Tonsils not enlarged. Uvula is midline. CV:  Good peripheral perfusion. RRR.  RESP:  Normal effort.  Right lungs reveal expiratory wheezing. ABD:  No distention.   NEURO: Cranial nerves II-XII intact. No focal deficits. Sensation and motor function intact. 5/5 muscle strength of UE & LE. Gait is steady.   ED Results / Procedures / Treatments   Labs (all labs  ordered are listed, but only abnormal results are displayed) Labs Reviewed  RESP PANEL BY RT-PCR (RSV, FLU A&B, COVID)  [id number]    EKG  Sinus Tachycardia  RADIOLOGY  I personally viewed and evaluated these images as part of my medical decision making, as well as reviewing the written report by the radiologist.  ED Provider Interpretation: Chest x-ray appears normal.  No consolidation visualized.  DG Chest 2 View Result Date: 04/02/2023 CLINICAL DATA:  Cough and cold symptoms EXAM: CHEST - 2 VIEW COMPARISON:  05/14/2021 FINDINGS: The heart size and mediastinal contours are within normal limits. Both lungs are clear. The visualized skeletal structures are unremarkable. IMPRESSION: No active cardiopulmonary disease. Electronically Signed   By: Minerva Fester M.D.   On: 04/02/2023 19:26    PROCEDURES:  Critical Care performed: No  Procedures   MEDICATIONS ORDERED IN ED: Medications - No data to display   IMPRESSION / MDM / ASSESSMENT AND PLAN / ED COURSE  I reviewed the triage vital signs and the nursing notes.  62 y.o. female presents to the emergency department for evaluation and treatment of acute cough. See HPI for further details.   Differential diagnosis includes, but is not limited to URI, PNA, PE  Patient's presentation is most consistent with acute complicated illness / injury requiring diagnostic workup.  Patient is alert and oriented.  On initial assessment her vital signs are hemodynamically stable however presenting in the room patient is tachycardic.  Respiratory panel and chest x-ray are reassuring.  Lung exam is pertinent for expiratory wheezing of the right lung.  Will treat with DuoNeb.  Serious fluid revealed on right ear exam.  No bulging or indications of otalgia media.  Given patient's fluctuant heart rate obtain EKG which revealed sinus tachycardia.  Obtained labs and D-dimer.  D-dimer revealed 0.63.  Will obtain CTA to  rule out PE. Transferring patient to Dr. Dolores Frame pending CTA to r/o PE.    FINAL CLINICAL IMPRESSION(S) / ED DIAGNOSES   Final diagnoses:  None   Rx / DC Orders   ED Discharge Orders     None        Note:  This document was prepared using Dragon voice recognition software and may include unintentional dictation errors.    Romeo Apple, Tanaka Gillen A, PA-C 04/03/23 Guy Franco, MD 04/03/23 [phone]

## 2023-04-03 ENCOUNTER — Emergency Department: Payer: MEDICAID

## 2023-04-03 DIAGNOSIS — R0902 Hypoxemia: Secondary | ICD-10-CM

## 2023-04-03 DIAGNOSIS — R079 Chest pain, unspecified: Secondary | ICD-10-CM

## 2023-04-03 DIAGNOSIS — K219 Gastro-esophageal reflux disease without esophagitis: Secondary | ICD-10-CM | POA: Insufficient documentation

## 2023-04-03 DIAGNOSIS — E876 Hypokalemia: Secondary | ICD-10-CM

## 2023-04-03 DIAGNOSIS — J209 Acute bronchitis, unspecified: Secondary | ICD-10-CM | POA: Diagnosis present

## 2023-04-03 DIAGNOSIS — E785 Hyperlipidemia, unspecified: Secondary | ICD-10-CM | POA: Insufficient documentation

## 2023-04-03 DIAGNOSIS — I10 Essential (primary) hypertension: Secondary | ICD-10-CM | POA: Insufficient documentation

## 2023-04-03 LAB — TROPONIN I (HIGH SENSITIVITY): Troponin I (High Sensitivity): 12 ng/L (ref <18)

## 2023-04-03 LAB — COMPREHENSIVE METABOLIC PANEL
ALT: 14 U/L (ref 0–44)
AST: 23 U/L (ref 15–41)
Albumin: 3.5 g/dL (ref 3.5–5.0)
Alkaline Phosphatase: 82 U/L (ref 38–126)
Anion gap: 12 (ref 5–15)
BUN: 15 mg/dL (ref 8–23)
CO2: 28 mmol/L (ref 22–32)
Calcium: 9 mg/dL (ref 8.9–10.3)
Chloride: 97 mmol/L — ABNORMAL LOW (ref 98–111)
Creatinine, Ser: 1.09 mg/dL — ABNORMAL HIGH (ref 0.44–1.00)
GFR, Estimated: 57 mL/min — ABNORMAL LOW (ref >60)
Glucose, Bld: 112 mg/dL — ABNORMAL HIGH (ref 70–99)
Potassium: 3.2 mmol/L — ABNORMAL LOW (ref 3.5–5.1)
Sodium: 137 mmol/L (ref 135–145)
Total Bilirubin: 0.6 mg/dL (ref <1.2)
Total Protein: 8 g/dL (ref 6.5–8.1)

## 2023-04-03 LAB — BASIC METABOLIC PANEL
Anion gap: 10 (ref 5–15)
BUN: 19 mg/dL (ref 8–23)
CO2: 26 mmol/L (ref 22–32)
Calcium: 8.6 mg/dL — ABNORMAL LOW (ref 8.9–10.3)
Chloride: 99 mmol/L (ref 98–111)
Creatinine, Ser: 1.04 mg/dL — ABNORMAL HIGH (ref 0.44–1.00)
GFR, Estimated: >60 mL/min (ref >60)
Glucose, Bld: 162 mg/dL — ABNORMAL HIGH (ref 70–99)
Potassium: 3.9 mmol/L (ref 3.5–5.1)
Sodium: 135 mmol/L (ref 135–145)

## 2023-04-03 LAB — CBC
HCT: 38 % (ref 36.0–46.0)
Hemoglobin: 12.6 g/dL (ref 12.0–15.0)
MCH: 28.5 pg (ref 26.0–34.0)
MCHC: 33.2 g/dL (ref 30.0–36.0)
MCV: 86 fL (ref 80.0–100.0)
Platelets: 185 10*3/uL (ref 150–400)
RBC: 4.42 MIL/uL (ref 3.87–5.11)
RDW: 14.6 % (ref 11.5–15.5)
WBC: 3 10*3/uL — ABNORMAL LOW (ref 4.0–10.5)
nRBC: 0 % (ref 0.0–0.2)

## 2023-04-03 LAB — HIV ANTIBODY (ROUTINE TESTING W REFLEX): HIV Screen 4th Generation wRfx: NONREACTIVE

## 2023-04-03 MED ORDER — NITROGLYCERIN 0.4 MG SL SUBL: 0.4000 mg | SUBLINGUAL_TABLET | SUBLINGUAL | Status: DC | PRN

## 2023-04-03 MED ORDER — MORPHINE SULFATE (PF) 4 MG/ML IV SOLN
4.0000 mg | Freq: Once | INTRAVENOUS | Status: AC
  Administered 2023-04-03: 4 mg via INTRAVENOUS
  Filled 2023-04-03: qty 1

## 2023-04-03 MED ORDER — TRAZODONE HCL 50 MG PO TABS: 25.0000 mg | ORAL_TABLET | Freq: Every evening | ORAL | Status: DC | PRN

## 2023-04-03 MED ORDER — POLYETHYLENE GLYCOL 3350 17 GM/SCOOP PO POWD: 17.0000 g | Freq: Every day | ORAL | Status: DC

## 2023-04-03 MED ORDER — ASPIRIN 81 MG PO TBEC
81.0000 mg | DELAYED_RELEASE_TABLET | Freq: Every day | ORAL | Status: DC
  Administered 2023-04-03 – 2023-04-05 (×3): 81 mg via ORAL
  Filled 2023-04-03 (×3): qty 1

## 2023-04-03 MED ORDER — SODIUM CHLORIDE 0.9 % IV BOLUS
500.0000 mL | Freq: Once | INTRAVENOUS | Status: AC
Start: 2023-04-03 — End: 2023-04-03
  Administered 2023-04-03: 500 mL via INTRAVENOUS

## 2023-04-03 MED ORDER — GUAIFENESIN ER 600 MG PO TB12
1200.0000 mg | ORAL_TABLET | Freq: Two times a day (BID) | ORAL | Status: DC
  Administered 2023-04-03 – 2023-04-05 (×5): 1200 mg via ORAL
  Filled 2023-04-03 (×5): qty 2

## 2023-04-03 MED ORDER — ACETAMINOPHEN 325 MG PO TABS
650.0000 mg | ORAL_TABLET | Freq: Four times a day (QID) | ORAL | Status: DC | PRN
  Administered 2023-04-03 – 2023-04-04 (×2): 650 mg via ORAL
  Filled 2023-04-03 (×2): qty 2

## 2023-04-03 MED ORDER — HYDROCOD POLI-CHLORPHE POLI ER 10-8 MG/5ML PO SUER
5.0000 mL | Freq: Two times a day (BID) | ORAL | Status: DC | PRN
  Administered 2023-04-03 – 2023-04-04 (×2): 5 mL via ORAL
  Filled 2023-04-03 (×2): qty 5

## 2023-04-03 MED ORDER — ATORVASTATIN CALCIUM 20 MG PO TABS
20.0000 mg | ORAL_TABLET | Freq: Every day | ORAL | Status: DC
  Administered 2023-04-03 – 2023-04-04 (×2): 20 mg via ORAL
  Filled 2023-04-03 (×3): qty 1

## 2023-04-03 MED ORDER — PSEUDOEPH-BROMPHEN-DM 30-2-10 MG/5ML PO SYRP
5.0000 mL | ORAL_SOLUTION | Freq: Three times a day (TID) | ORAL | Status: DC | PRN
Start: 2023-04-03 — End: 2023-04-03

## 2023-04-03 MED ORDER — POLYETHYLENE GLYCOL 3350 17 G PO PACK
17.0000 g | PACK | Freq: Every day | ORAL | Status: DC
Start: 2023-04-03 — End: 2023-04-05
  Administered 2023-04-04 – 2023-04-05 (×2): 17 g via ORAL
  Filled 2023-04-03 (×2): qty 1

## 2023-04-03 MED ORDER — ENOXAPARIN SODIUM 80 MG/0.8ML IJ SOSY
65.0000 mg | PREFILLED_SYRINGE | INTRAMUSCULAR | Status: DC
  Administered 2023-04-03 – 2023-04-05 (×3): 65 mg via SUBCUTANEOUS
  Filled 2023-04-03 (×3): qty 0.65

## 2023-04-03 MED ORDER — ACETAMINOPHEN 650 MG RE SUPP: 650.0000 mg | Freq: Four times a day (QID) | RECTAL | Status: DC | PRN

## 2023-04-03 MED ORDER — IPRATROPIUM-ALBUTEROL 0.5-2.5 (3) MG/3ML IN SOLN
3.0000 mL | Freq: Four times a day (QID) | RESPIRATORY_TRACT | Status: DC
  Administered 2023-04-03 – 2023-04-05 (×10): 3 mL via RESPIRATORY_TRACT
  Filled 2023-04-03 (×10): qty 3

## 2023-04-03 MED ORDER — ONDANSETRON HCL 4 MG/2ML IJ SOLN: 4.0000 mg | Freq: Four times a day (QID) | INTRAMUSCULAR | Status: DC | PRN

## 2023-04-03 MED ORDER — ONDANSETRON 4 MG PO TBDP
4.0000 mg | ORAL_TABLET | Freq: Once | ORAL | Status: AC
  Administered 2023-04-03: 4 mg via ORAL
  Filled 2023-04-03: qty 1

## 2023-04-03 MED ORDER — MORPHINE SULFATE (PF) 2 MG/ML IV SOLN: 2.0000 mg | INTRAVENOUS | Status: DC | PRN

## 2023-04-03 MED ORDER — PANTOPRAZOLE SODIUM 40 MG PO TBEC
40.0000 mg | DELAYED_RELEASE_TABLET | Freq: Every day | ORAL | Status: DC
  Administered 2023-04-03 – 2023-04-05 (×3): 40 mg via ORAL
  Filled 2023-04-03 (×3): qty 1

## 2023-04-03 MED ORDER — DILTIAZEM HCL ER COATED BEADS 120 MG PO CP24
360.0000 mg | ORAL_CAPSULE | Freq: Every day | ORAL | Status: DC
  Administered 2023-04-03 – 2023-04-05 (×3): 360 mg via ORAL
  Filled 2023-04-03 (×3): qty 3

## 2023-04-03 MED ORDER — POTASSIUM CHLORIDE IN NACL 20-0.9 MEQ/L-% IV SOLN
INTRAVENOUS | Status: DC
  Filled 2023-04-03: qty 1000

## 2023-04-03 MED ORDER — FLUTICASONE PROPIONATE HFA 110 MCG/ACT IN AERO: 2.0000 | INHALATION_SPRAY | Freq: Two times a day (BID) | RESPIRATORY_TRACT | Status: DC | PRN

## 2023-04-03 MED ORDER — GUAIFENESIN ER 600 MG PO TB12
600.0000 mg | ORAL_TABLET | Freq: Every day | ORAL | Status: DC
  Filled 2023-04-03: qty 1

## 2023-04-03 MED ORDER — IPRATROPIUM-ALBUTEROL 0.5-2.5 (3) MG/3ML IN SOLN
3.0000 mL | RESPIRATORY_TRACT | Status: DC | PRN
  Administered 2023-04-04: 3 mL via RESPIRATORY_TRACT
  Filled 2023-04-03: qty 3

## 2023-04-03 MED ORDER — POTASSIUM CHLORIDE CRYS ER 20 MEQ PO TBCR
40.0000 meq | EXTENDED_RELEASE_TABLET | Freq: Once | ORAL | Status: AC
  Administered 2023-04-03: 40 meq via ORAL
  Filled 2023-04-03: qty 2

## 2023-04-03 MED ORDER — DOXYCYCLINE HYCLATE 100 MG PO TABS
100.0000 mg | ORAL_TABLET | Freq: Two times a day (BID) | ORAL | Status: DC
  Administered 2023-04-03 – 2023-04-05 (×5): 100 mg via ORAL
  Filled 2023-04-03 (×6): qty 1

## 2023-04-03 MED ORDER — MAGNESIUM HYDROXIDE 400 MG/5ML PO SUSP: 30.0000 mL | Freq: Every day | ORAL | Status: DC | PRN

## 2023-04-03 MED ORDER — SODIUM CHLORIDE 0.9 % IV SOLN
1.0000 g | INTRAVENOUS | Status: DC
  Administered 2023-04-03: 1 g via INTRAVENOUS
  Filled 2023-04-03: qty 10

## 2023-04-03 MED ORDER — METHYLPREDNISOLONE SODIUM SUCC 40 MG IJ SOLR
40.0000 mg | Freq: Two times a day (BID) | INTRAMUSCULAR | Status: AC
  Administered 2023-04-03 – 2023-04-04 (×3): 40 mg via INTRAVENOUS
  Filled 2023-04-03 (×4): qty 1

## 2023-04-03 MED ORDER — IOHEXOL 350 MG/ML SOLN
100.0000 mL | Freq: Once | INTRAVENOUS | Status: AC | PRN
  Administered 2023-04-03: 100 mL via INTRAVENOUS

## 2023-04-03 MED ORDER — DIPHENHYDRAMINE HCL 25 MG PO CAPS
50.0000 mg | ORAL_CAPSULE | Freq: Every day | ORAL | Status: DC | PRN
  Administered 2023-04-04: 50 mg via ORAL
  Filled 2023-04-03: qty 2

## 2023-04-03 MED ORDER — ONDANSETRON HCL 4 MG PO TABS: 4.0000 mg | ORAL_TABLET | Freq: Four times a day (QID) | ORAL | Status: DC | PRN

## 2023-04-03 NOTE — Assessment & Plan Note (Addendum)
-   We will follow serial troponins. - She be placed on aspirin as well as as needed sublingual nitroglycerin and morphine sulfate for pain. - Cardiology consult will be obtained. - I notified [facility] group about the patient.

## 2023-04-03 NOTE — Progress Notes (Signed)
PHARMACIST - PHYSICIAN COMMUNICATION  CONCERNING:  Enoxaparin (Lovenox) for DVT Prophylaxis    RECOMMENDATION: Patient was prescribed enoxaprin 40mg  q24 hours for VTE prophylaxis.   Filed Weights   04/02/23 1638  Weight: 131.1 kg (289 lb)    Body mass index is 43.94 kg/m.  Estimated Creatinine Clearance: 75.7 mL/min (A) (by C-G formula based on SCr of 1.09 mg/dL (H)).   Based on [facility] policy patient is candidate for enoxaparin 0.5mg /kg TBW SQ every 24 hours based on BMI being >30.  DESCRIPTION: Pharmacy has adjusted enoxaparin dose per [facility] policy.  Patient is now receiving enoxaparin 0.5 mg/kg every 24 hours   Otelia Sergeant, PharmD, [facility] 04/03/2023 2:08 AM

## 2023-04-03 NOTE — Progress Notes (Signed)
Subjective: Patient admitted this morning, see detailed H&P by Dr Arville Care 62 y.o. female with medical history significant for anxiety, osteoarthritis, bipolar disorder, depression, GERD, hypertension and dyslipidemia as well as sleep apnea, who presented to emergency room with acute onset of cough and right otalgia for the last 4 days.  She has tried NyQuil and Alka-Seltzer with no relief.  She denied any fever or chills.  She admitted to chest pain and that she graded 3-4/10 in severity that is sharp and worsening with cough.  Two-view chest x-ray showed no acute cardiopulmonary disease. Chest CTA revealed no evidence for PE or other acute thoracic process.  Vitals:   04/03/23 0530 04/03/23 0534  BP: (!) 151/102   Pulse: 69   Resp: 19   Temp:  98.8 F (37.1 C)  SpO2: 93%       A/P   Acute bronchitis with bronchospasm -Continue DuoNeb nebulizers 4 times daily -Start Solu-Medrol 40 mg IV every 12 hours, Mucinex 1200 mg p.o. twice daily -Discontinue Rocephin, start doxycycline 100 mg p.o. twice daily -Wean off oxygen as tolerated   Chest pain -Patient denies chest pain at this time -Pain is musculoskeletal from coughing -Troponin 12, EKG unremarkable -Will discontinue cardiology consultation    Hypokalemia - Replete   Dyslipidemia - We will continue Statin therapy.   Essential hypertension - We will continue antihypertensive therapy. -Continue Cardizem   GERD without esophagitis - We will continue PPI therapy.   Meredeth Ide [facility]

## 2023-04-03 NOTE — Assessment & Plan Note (Signed)
-   We will continue Statin therapy 

## 2023-04-03 NOTE — ED Notes (Signed)
Pt to restroom via wheelchair. Pt becomes short of breath with exertion.

## 2023-04-03 NOTE — ED Notes (Signed)
Provided pt with a [place] tray sandwich. Reports headache was better but when she sat up to eat the discomfort increased. Pt eating no other needs at this time

## 2023-04-03 NOTE — ED Notes (Signed)
This tech brought pt in a wheelchair to bathroom and back to room. Pt tolerated well.

## 2023-04-03 NOTE — Assessment & Plan Note (Addendum)
-   The patient be admitted to a cardiac telemetry observation bed. - This is associated with mild hypoxemia. - We will place the patient on IV Rocephin. - We will add bronchodilator therapy with DuoNebs as well as mucolytic therapy. - She will be hydrated with IV normal saline. - O2 protocol will be followed. - We will hold off long-acting beta agonist for now.

## 2023-04-03 NOTE — H&P (Addendum)
[place]   PATIENT NAME: Christina Richards    MR#:  [id number]  DATE OF BIRTH:  1960/07/31  DATE OF ADMISSION:  04/02/2023  PRIMARY CARE PHYSICIAN: [facility]   Patient is coming from: Home  REQUESTING/REFERRING PHYSICIAN: Chiquita Loth  CHIEF COMPLAINT:   Chief Complaint  Patient presents with   Cough    HISTORY OF PRESENT ILLNESS:  Christina Richards is a 62 y.o. female with medical history significant for anxiety, osteoarthritis, bipolar disorder, depression, GERD, hypertension and dyslipidemia as well as sleep apnea, who presented to emergency room with acute onset of cough and right otalgia for the last 4 days.  She has tried NyQuil and Alka-Seltzer with no relief.  She denied any fever or chills.  She admitted to chest pain and that she graded 3-4/10 in severity that is sharp and worsening with cough.  She stated that it feels like a shock all over her body.  She admitted to associated nausea without vomiting.  abdominal pain.  She has been having a constellation of symptoms similar to her URI and bronchitis that she had in the past.  No headache or dizziness or blurred vision.  No dysuria, oliguria or hematuria or flank pain.  No chest pain or palpitations.  No bleeding diathesis  ED Course: When the patient came to the ER, BP was 184/92 with otherwise normal vital signs.  Labs revealed kalemia of 3.2 and hypochloremia of 97 with a creatinine 1.09 and otherwise unremarkable CMP.  High sensitive troponin I was 12.  CBC showed WBCs of 3.8.  D-dimer was 0.63.  Respiratory panel came back negative. EKG as reviewed by me : EKG showed sinus tachycardia with rate 113 with no acute findings. Imaging: Two-view chest x-ray showed no acute cardiopulmonary disease.  Chest CTA revealed no evidence for PE or other acute thoracic process.  The patient was given 4 mg of IV morphine sulfate, 4 mg of IV Zofran, 40 mill equivalent p.o. potassium chloride, 60 mg p.o. prednisone  and 500 mL IV normal saline bolus.  She will be admitted to an observation medical bed for further evaluation and management. PAST MEDICAL HISTORY:   Past Medical History:  Diagnosis Date   Allergy    Environmental   Anemia    Anxiety    Arthritis    Bipolar disorder (HCC)    Cocaine abuse (HCC)    smokes crack   Coronary artery disease    Depression    GERD (gastroesophageal reflux disease)    History of borderline personality disorder    History of rotator cuff surgery 05/09/2017   right shoulder   Hypercholesteremia    Hypertension    Sleep apnea    does not use CPAP    PAST SURGICAL HISTORY:   Past Surgical History:  Procedure Laterality Date   ECTOPIC PREGNANCY SURGERY     HAND SURGERY Right    thumb fusion   RESECTION DISTAL CLAVICAL  09/26/2020   Procedure: ARTHROSCOPIC RESECTION DISTAL CLAVICAL;  Surgeon: Bjorn Pippin, MD;  Location: [facility];  Service: Orthopedics;;   ROTATOR CUFF REPAIR     SHOULDER ARTHROSCOPY WITH ROTATOR CUFF REPAIR AND SUBACROMIAL DECOMPRESSION Right 09/26/2020   Procedure: SHOULDER ARTHROSCOPY DEBRIDEMENT WITH SUBACROMIAL DECOMPRESSION AND DISTAL CLAVICLE EXCISION AND ARTHROSCOPIC REVISION ROTATOR CUFF REPAIR;  Surgeon: Bjorn Pippin, MD;  Location: [facility];  Service: Orthopedics;  Laterality: Right;    SOCIAL HISTORY:  Social History   Tobacco Use   Smoking status: Every Day    Types: Cigars   Smokeless tobacco: Never   Tobacco comments:    Smoked cigarettes x 10 year 1 pack daily  Substance Use Topics   Alcohol use: No    FAMILY HISTORY:   Positive for CVA in her father and colon cancer in her mother.  DRUG ALLERGIES:   Allergies  Allergen Reactions   Ibuprofen Nausea Only    REVIEW OF SYSTEMS:   ROS As per history of present illness. All pertinent systems were reviewed above. Constitutional, HEENT, cardiovascular, respiratory, GI, GU, musculoskeletal, neuro, psychiatric, endocrine,  integumentary and hematologic systems were reviewed and are otherwise negative/unremarkable except for positive findings mentioned above in the HPI.   MEDICATIONS AT HOME:   Prior to Admission medications   Medication Sig Start Date End Date Taking? Authorizing Provider  atorvastatin (LIPITOR) 20 MG tablet Take 20 mg by mouth at bedtime. 01/28/21   [provider]  azelastine (OPTIVAR) 0.05 % ophthalmic solution Place 1 drop into both eyes 2 (two) times daily. Patient taking differently: Place 1 drop into both eyes 2 (two) times daily as needed (allergies). 12/19/21   Waldon Merl, PA-C  brompheniramine-pseudoephedrine-DM 30-2-10 MG/5ML syrup Take 5 mLs by mouth 3 (three) times daily as needed. 07/21/22   Freddy Finner, NP  diclofenac Sodium (VOLTAREN) 1 % GEL Apply 1 Application topically 4 (four) times daily as needed (pain).    [provider]  diltiazem (TIAZAC) 360 MG 24 hr capsule Take 1 capsule (360 mg total) by mouth daily. 05/26/19   Johnna Acosta, NP  diphenhydrAMINE (BENADRYL) 25 MG tablet Take 50 mg by mouth daily as needed for itching.    [provider]  FLOVENT HFA 110 MCG/ACT inhaler Inhale 2 puffs into the lungs 2 (two) times daily as needed (shortness of breath). Patient not taking: Reported on 08/07/2022 05/07/21   [provider]  guaiFENesin (MUCINEX) 600 MG 12 hr tablet Take 600 mg by mouth daily.    [provider]  Incontinence Supply Disposable (RA PADS FOR WOMEN) MISC Use      Use 3-4 bed incontinence pads every time napping or sleeping, uses 8-10 in a day R32 07/24/21   [provider]  levocetirizine (XYZAL) 5 MG tablet Take 1 tablet (5 mg total) by mouth every evening. Patient not taking: Reported on 08/07/2022 12/19/21   Waldon Merl, PA-C  omeprazole (PRILOSEC) 20 MG capsule Take 20 mg by mouth daily as needed (acid reflux). 05/18/22   [provider]  polyethylene glycol powder (GLYCOLAX/MIRALAX) 17  GM/SCOOP powder Take 17 g by mouth daily.    [provider]  pregabalin (LYRICA) 50 MG capsule Take 1 capsule (50 mg total) by mouth 2 (two) times daily. X 1 week, then 1 tab in Am and 2 tabs at night x 1 week, then 100 mg BID- for fibromyalgia. Patient not taking: Reported on 06/23/2022 09/24/21   Genice Rouge, MD  PROVENTIL HFA 108 [phone] Base) MCG/ACT inhaler Inhale 2 puffs into the lungs every 4 (four) hours as needed. 02/18/21   [provider]  SYMBICORT 160-4.5 MCG/ACT inhaler Inhale 2 puffs into the lungs 2 (two) times daily. Patient not taking: Reported on 11/18/2022 07/27/22   [provider]      VITAL SIGNS:  Blood pressure (!) 150/97, pulse 88, temperature 97.8 F (36.6 C), temperature source Axillary, resp. rate (!) 22, height 5\' 8"  (  1.727 m), weight 131.1 kg, last menstrual period 12/08/2012, SpO2 97%.  PHYSICAL EXAMINATION:  Physical Exam  GENERAL:  62 y.o.-year-old patient lying in the bed with mild respiratory distress due to cough. EYES: Pupils equal, round, reactive to light and accommodation. No scleral icterus. Extraocular muscles intact.  HEENT: Head atraumatic, normocephalic. Oropharynx and nasopharynx clear.  Right TM with mild bulging without erythema. NECK:  Supple, no jugular venous distention. No thyroid enlargement, no tenderness.  LUNGS: Harsh breath sounds with diffuse inspiratory and expiratory rhonchi as well as expiratory wheezes.  No use of accessory muscles of respiration.  CARDIOVASCULAR: Regular rate and rhythm, S1, S2 normal. No murmurs, rubs, or gallops.  ABDOMEN: Soft, nondistended, nontender. Bowel sounds present. No organomegaly or mass.  EXTREMITIES: No pedal edema, cyanosis, or clubbing.  NEUROLOGIC: Cranial nerves II through XII are intact. Muscle strength 5/5 in all extremities. Sensation intact. Gait not checked.  PSYCHIATRIC: The patient is alert and oriented x 3.  Normal affect and good eye contact. SKIN: No obvious rash,  lesion, or ulcer.   LABORATORY PANEL:   CBC Recent Labs  Lab 04/02/23 2331  WBC 3.8*  HGB 13.6  HCT 41.1  PLT 205   ------------------------------------------------------------------------------------------------------------------  Chemistries  Recent Labs  Lab 04/02/23 2331  NA 137  K 3.2*  CL 97*  CO2 28  GLUCOSE 112*  BUN 15  CREATININE 1.09*  CALCIUM 9.0  AST 23  ALT 14  ALKPHOS 82  BILITOT 0.6   ------------------------------------------------------------------------------------------------------------------  Cardiac Enzymes No results for input(s): "TROPONINI" in the last 168 hours. ------------------------------------------------------------------------------------------------------------------  RADIOLOGY:  CT Angio Chest Pulmonary Embolism (PE) W or WO Contrast Result Date: 04/03/2023 CLINICAL DATA:  Pulmonary embolism (PE) suspected, low to intermediate prob, positive D-dimer EXAM: CT ANGIOGRAPHY CHEST WITH CONTRAST TECHNIQUE: Multidetector CT imaging of the chest was performed using the standard protocol during bolus administration of intravenous contrast. Multiplanar CT image reconstructions and MIPs were obtained to evaluate the vascular anatomy. RADIATION DOSE REDUCTION: This exam was performed according to the departmental dose-optimization program which includes automated exposure control, adjustment of the mA and/or kV according to patient size and/or use of iterative reconstruction technique. CONTRAST:  OMNIPAQUE IOHEXOL 350 MG/ML SOLN COMPARISON:  None Available. FINDINGS: Cardiovascular: Satisfactory opacification of the pulmonary arteries to the segmental level. No evidence of pulmonary embolism. Normal heart size. No pericardial effusion. Mediastinum/Nodes: No enlarged mediastinal, hilar, or axillary lymph nodes. Thyroid gland, trachea, and esophagus demonstrate no significant findings. Lungs/Pleura: Lungs are clear. No pleural effusion or  pneumothorax. Upper Abdomen: No acute abnormality. Musculoskeletal: No chest wall abnormality. No acute or significant osseous findings. Review of the MIP images confirms the above findings. IMPRESSION: No evidence of PE or other acute cardiothoracic process. Electronically Signed   By: Layla Maw M.D.   On: 04/03/2023 00:49   DG Chest 2 View Result Date: 04/02/2023 CLINICAL DATA:  Cough and cold symptoms EXAM: CHEST - 2 VIEW COMPARISON:  05/14/2021 FINDINGS: The heart size and mediastinal contours are within normal limits. Both lungs are clear. The visualized skeletal structures are unremarkable. IMPRESSION: No active cardiopulmonary disease. Electronically Signed   By: Minerva Fester M.D.   On: 04/02/2023 19:26      IMPRESSION AND PLAN:  Assessment and Plan: * Acute bronchitis with bronchospasm - The patient be admitted to a cardiac telemetry observation bed. - This is associated with mild hypoxemia. - We will place the patient on IV Rocephin. - We will add bronchodilator therapy with DuoNebs  as well as mucolytic therapy. - She will be hydrated with IV normal saline. - O2 protocol will be followed. - We will hold off long-acting beta agonist for now.  Chest pain - We will follow serial troponins. - She be placed on aspirin as well as as needed sublingual nitroglycerin and morphine sulfate for pain. - Cardiology consult will be obtained. - I notified [facility] group about the patient.  Hypokalemia - Potassium will be replaced and magnesium level will be checked.  Dyslipidemia - We will continue Statin therapy.  Essential hypertension - We will continue antihypertensive therapy.  GERD without esophagitis - We will continue PPI therapy.       DVT prophylaxis: Lovenox.  Advanced Care Planning:  Code Status: full code.  Family Communication:  The plan of care was discussed in details with the patient (and family). I answered all questions. The patient agreed to proceed  with the above mentioned plan. Further management will depend upon hospital course. Disposition Plan: Back to previous home environment Consults called: none.  All the records are reviewed and case discussed with ED provider.  Status is: Observation  I certify that at the time of admission, it is my clinical judgment that the patient will require  hospital care extending less than 2 midnights.                            Dispo: The patient is from: Home              Anticipated d/c is to: Home              Patient currently is not medically stable to d/c.              Difficult to place patient: No  Hannah Beat M.D on 04/03/2023 at 5:29 AM  [facility]   From 7 PM-7 AM, contact night-coverage [email or web address]  CC: Primary care physician; [facility]

## 2023-04-03 NOTE — Assessment & Plan Note (Signed)
-   Potassium will be replaced and magnesium level will be checked. ?

## 2023-04-03 NOTE — ED Provider Notes (Signed)
-----------------------------------------   1:45 AM on 04/03/2023 -----------------------------------------   Updated patient of negative CTA chest for PE.  Patient requiring 2 L nasal cannula oxygen for room air saturation 91%.  Consult hospitalist services for evaluation and admission.   Irean Hong, MD 04/03/23 [phone]

## 2023-04-03 NOTE — Assessment & Plan Note (Signed)
-   We will continue anti-hypertensive therapy. 

## 2023-04-03 NOTE — Assessment & Plan Note (Signed)
-   We will continue PPI therapy 

## 2023-04-04 DIAGNOSIS — K219 Gastro-esophageal reflux disease without esophagitis: Secondary | ICD-10-CM | POA: Diagnosis present

## 2023-04-04 DIAGNOSIS — I2510 Atherosclerotic heart disease of native coronary artery without angina pectoris: Secondary | ICD-10-CM | POA: Diagnosis present

## 2023-04-04 DIAGNOSIS — F17290 Nicotine dependence, other tobacco product, uncomplicated: Secondary | ICD-10-CM | POA: Diagnosis present

## 2023-04-04 DIAGNOSIS — Z7951 Long term (current) use of inhaled steroids: Secondary | ICD-10-CM | POA: Diagnosis not present

## 2023-04-04 DIAGNOSIS — J209 Acute bronchitis, unspecified: Secondary | ICD-10-CM | POA: Diagnosis present

## 2023-04-04 DIAGNOSIS — E878 Other disorders of electrolyte and fluid balance, not elsewhere classified: Secondary | ICD-10-CM | POA: Diagnosis present

## 2023-04-04 DIAGNOSIS — M797 Fibromyalgia: Secondary | ICD-10-CM | POA: Diagnosis present

## 2023-04-04 DIAGNOSIS — E876 Hypokalemia: Secondary | ICD-10-CM | POA: Diagnosis present

## 2023-04-04 DIAGNOSIS — E66813 Obesity, class 3: Secondary | ICD-10-CM | POA: Diagnosis present

## 2023-04-04 DIAGNOSIS — G4730 Sleep apnea, unspecified: Secondary | ICD-10-CM | POA: Diagnosis present

## 2023-04-04 DIAGNOSIS — F319 Bipolar disorder, unspecified: Secondary | ICD-10-CM | POA: Diagnosis present

## 2023-04-04 DIAGNOSIS — E7800 Pure hypercholesterolemia, unspecified: Secondary | ICD-10-CM | POA: Diagnosis present

## 2023-04-04 DIAGNOSIS — F603 Borderline personality disorder: Secondary | ICD-10-CM | POA: Diagnosis present

## 2023-04-04 DIAGNOSIS — Z79899 Other long term (current) drug therapy: Secondary | ICD-10-CM | POA: Diagnosis not present

## 2023-04-04 DIAGNOSIS — Z823 Family history of stroke: Secondary | ICD-10-CM | POA: Diagnosis not present

## 2023-04-04 DIAGNOSIS — Z6841 Body Mass Index (BMI) 40.0 and over, adult: Secondary | ICD-10-CM | POA: Diagnosis not present

## 2023-04-04 DIAGNOSIS — Z7982 Long term (current) use of aspirin: Secondary | ICD-10-CM | POA: Diagnosis not present

## 2023-04-04 DIAGNOSIS — J9601 Acute respiratory failure with hypoxia: Secondary | ICD-10-CM | POA: Diagnosis present

## 2023-04-04 DIAGNOSIS — Z5900 Homelessness unspecified: Secondary | ICD-10-CM | POA: Diagnosis not present

## 2023-04-04 DIAGNOSIS — Z800 Family history of malignant neoplasm of digestive organs: Secondary | ICD-10-CM | POA: Diagnosis not present

## 2023-04-04 DIAGNOSIS — I10 Essential (primary) hypertension: Secondary | ICD-10-CM | POA: Diagnosis present

## 2023-04-04 DIAGNOSIS — Z888 Allergy status to other drugs, medicaments and biological substances status: Secondary | ICD-10-CM | POA: Diagnosis not present

## 2023-04-04 DIAGNOSIS — H9201 Otalgia, right ear: Secondary | ICD-10-CM | POA: Diagnosis present

## 2023-04-04 MED ORDER — METHYLPREDNISOLONE SODIUM SUCC 40 MG IJ SOLR
40.0000 mg | Freq: Every day | INTRAMUSCULAR | Status: DC
  Administered 2023-04-05: 40 mg via INTRAVENOUS
  Filled 2023-04-04: qty 1

## 2023-04-04 MED ORDER — ASPIRIN-ACETAMINOPHEN-CAFFEINE 250-250-65 MG PO TABS
2.0000 | ORAL_TABLET | Freq: Once | ORAL | Status: DC
  Filled 2023-04-04: qty 2

## 2023-04-04 MED ORDER — HYDRALAZINE HCL 20 MG/ML IJ SOLN: 5.0000 mg | Freq: Four times a day (QID) | INTRAMUSCULAR | Status: DC | PRN

## 2023-04-04 MED ORDER — ASPIRIN 325 MG PO TABS
325.0000 mg | ORAL_TABLET | Freq: Once | ORAL | Status: AC
  Administered 2023-04-04: 325 mg via ORAL
  Filled 2023-04-04: qty 1

## 2023-04-04 MED ORDER — ACETAMINOPHEN 650 MG RE SUPP: 650.0000 mg | Freq: Four times a day (QID) | RECTAL | Status: DC | PRN

## 2023-04-04 MED ORDER — METHYLPREDNISOLONE SODIUM SUCC 40 MG IJ SOLR
40.0000 mg | Freq: Once | INTRAMUSCULAR | Status: AC
  Administered 2023-04-04: 40 mg via INTRAVENOUS
  Filled 2023-04-04: qty 1

## 2023-04-04 MED ORDER — METHYLPREDNISOLONE SODIUM SUCC 125 MG IJ SOLR
80.0000 mg | Freq: Every day | INTRAMUSCULAR | Status: DC
Start: 2023-04-05 — End: 2023-04-04

## 2023-04-04 MED ORDER — ACETAMINOPHEN 325 MG PO TABS
650.0000 mg | ORAL_TABLET | Freq: Four times a day (QID) | ORAL | Status: DC | PRN
  Administered 2023-04-05: 650 mg via ORAL
  Filled 2023-04-04: qty 2

## 2023-04-04 MED ORDER — ACETAMINOPHEN-CAFFEINE 500-65 MG PO TABS
2.0000 | ORAL_TABLET | Freq: Once | ORAL | Status: AC
  Administered 2023-04-04: 2 via ORAL
  Filled 2023-04-04: qty 2

## 2023-04-04 NOTE — Hospital Course (Addendum)
HPI: 62 y.o. female with medical history significant for anxiety, osteoarthritis, bipolar disorder, depression, GERD, hypertension and dyslipidemia as well as sleep apnea, who presented to emergency room with acute onset of cough and right otalgia for the last 4 days.   Hospital course / significant events:  12/14: admitted to hospitalist service for bronchitis/bronchospasm w/ acute hypoxic respiratory failure 12/15: still requiring O2, severe SOB w/ ambulation   Consultants:  none  Procedures/Surgeries: none      ASSESSMENT & PLAN:   Acute bronchitis with bronchospasm DuoNeb nebulizers 4 times daily Solu-Medrol 40 mg IV every 12 hours Mucinex 1200 mg p.o. twice daily doxycycline 100 mg p.o. twice daily O2 as below   Acute hypoxic respiratory failure Desaturates especially w/ cough and ambulation Supplemental O2 as needed Plan ambulatory walk test    Chest pain - resolved  Likely musculoskeletal from coughing Troponin 12, EKG unremarkable monitor  Hypokalemia Replace as needed Monitor BMP  Dyslipidemia statin   Essential hypertension Continue Cardizem   GERD without esophagitis PPI .     Class III obesity based on BMI: Body mass index is 43.94 kg/m.  Underweight - under 18  overweight - 25 to 29 obese - 30 or more Class 1 obesity: BMI of 30.0 to 34 Class 2 obesity: BMI of 35.0 to 39 Class 3 obesity: BMI of 40.0 to 49 Super Morbid Obesity: BMI 50-59 Super-super Morbid Obesity: BMI 60+ Significantly low or high BMI is associated with higher medical risk.  Weight management advised as adjunct to other disease management and risk reduction treatments    DVT prophylaxis: lovenox  IV fluids: no continuous IV fluids  Nutrition: cardiac diet  Central lines / invasive devices: none  Code Status: FULL CODE ACP documentation reviewed: none none on file in [facility]  TOC needs: TBD Barriers to dispo / significant pending items: O2 requirement

## 2023-04-04 NOTE — ED Notes (Signed)
Pt ask for ice water, provided.

## 2023-04-04 NOTE — Progress Notes (Signed)
PROGRESS NOTE    Christina Richards   OZH:[id number] DOB: [birth year]  DOA: 04/02/2023 Date of Service: 04/04/23 which is hospital day 0  PCP: [facility]    HPI: 62 y.o. female with medical history significant for anxiety, osteoarthritis, bipolar disorder, depression, GERD, hypertension and dyslipidemia as well as sleep apnea, who presented to emergency room with acute onset of cough and right otalgia for the last 4 days.   Hospital course / significant events:  12/14: admitted to hospitalist service for bronchitis/bronchospasm w/ acute hypoxic respiratory failure 12/15: still requiring O2, severe SOB w/ ambulation   Consultants:  none  Procedures/Surgeries: none      ASSESSMENT & PLAN:   Acute bronchitis with bronchospasm DuoNeb nebulizers 4 times daily Solu-Medrol 40 mg IV every 12 hours Mucinex 1200 mg p.o. twice daily doxycycline 100 mg p.o. twice daily O2 as below   Acute hypoxic respiratory failure Desaturates especially w/ cough and ambulation Supplemental O2 as needed Plan ambulatory walk test    Chest pain - resolved  Likely musculoskeletal from coughing Troponin 12, EKG unremarkable monitor  Hypokalemia Replace as needed Monitor BMP  Dyslipidemia statin   Essential hypertension Continue Cardizem   GERD without esophagitis PPI .     Class III obesity based on BMI: Body mass index is 43.94 kg/m.  Underweight - under 18  overweight - 25 to 29 obese - 30 or more Class 1 obesity: BMI of 30.0 to 34 Class 2 obesity: BMI of 35.0 to 39 Class 3 obesity: BMI of 40.0 to 49 Super Morbid Obesity: BMI 50-59 Super-super Morbid Obesity: BMI 60+ Significantly low or high BMI is associated with higher medical risk.  Weight management advised as adjunct to other disease management and risk reduction treatments    DVT prophylaxis: lovenox  IV fluids: no continuous IV fluids  Nutrition: cardiac diet  Central lines / invasive  devices: none  Code Status: FULL CODE ACP documentation reviewed: none none on file in [facility]  TOC needs: TBD Barriers to dispo / significant pending items: O2 requirement              Subjective / Brief ROS:  Patient reports severe cough, SOB w/ ambulation, SOB w/ cough, generalized weakness Denies CP Pain controlled.  Denies new weakness.  Tolerating diet.  Reports no concerns w/ urination/defecation.   Family Communication: none at this time     Objective Findings:  Vitals:   04/04/23 0516 04/04/23 0700 04/04/23 1118 04/04/23 1147  BP: 136/78 (!) 142/75 (!) 144/80 (!) 160/111  Pulse: 74 77 82 92  Resp: 15 15 18 14   Temp: 98.2 F (36.8 C)  98.4 F (36.9 C) 97.9 F (36.6 C)  TempSrc: Oral   Oral  SpO2: 96% 93% 95% 97%  Weight:      Height:       No intake or output data in the 24 hours ending 04/04/23 1422 Filed Weights   04/02/23 1638  Weight: 131.1 kg    Examination:  Physical Exam Constitutional:      General: She is not in acute distress. Cardiovascular:     Rate and Rhythm: Normal rate and regular rhythm.  Pulmonary:     Effort: No respiratory distress.     Breath sounds: Wheezing present.  Abdominal:     Palpations: Abdomen is soft.  Musculoskeletal:     Right lower leg: No edema.     Left lower leg: No edema.  Skin:    General:  Skin is warm and dry.  Neurological:     General: No focal deficit present.     Mental Status: She is alert.          Scheduled Medications:   aspirin EC  81 mg Oral Daily   atorvastatin  20 mg Oral QHS   diltiazem  360 mg Oral Daily   doxycycline  100 mg Oral Q12H   enoxaparin (LOVENOX) injection  65 mg Subcutaneous Q24H   guaiFENesin  1,200 mg Oral BID   ipratropium-albuterol  3 mL Nebulization QID   [START ON 04/05/2023] methylPREDNISolone (SOLU-MEDROL) injection  80 mg Intravenous Daily   methylPREDNISolone (SOLU-MEDROL) injection  40 mg Intravenous Q12H   pantoprazole  40 mg Oral Daily    polyethylene glycol  17 g Oral Daily    Continuous Infusions:   PRN Medications:  acetaminophen **OR** acetaminophen, chlorpheniramine-HYDROcodone, diphenhydrAMINE, ipratropium-albuterol, ondansetron **OR** ondansetron (ZOFRAN) IV, traZODone  Antimicrobials from admission:  Anti-infectives (From admission, onward)    Start     Dose/Rate Route Frequency Ordered Stop   04/03/23 1500  doxycycline (VIBRA-TABS) tablet 100 mg        100 mg Oral Every 12 hours 04/03/23 1453     04/03/23 0215  cefTRIAXone (ROCEPHIN) 1 g in sodium chloride 0.9 % 100 mL IVPB  Status:  Discontinued        1 g 200 mL/hr over 30 Minutes Intravenous Every 24 hours 04/03/23 0206 04/03/23 1453           Data Reviewed:  I have personally reviewed the following...  CBC: Recent Labs  Lab 04/02/23 2331 04/03/23 0539  WBC 3.8* 3.0*  NEUTROABS 2.7  --   HGB 13.6 12.6  HCT 41.1 38.0  MCV 86.9 86.0  PLT 205 185   Basic Metabolic Panel: Recent Labs  Lab 04/02/23 2331 04/03/23 0539  NA 137 135  K 3.2* 3.9  CL 97* 99  CO2 28 26  GLUCOSE 112* 162*  BUN 15 19  CREATININE 1.09* 1.04*  CALCIUM 9.0 8.6*   GFR: Estimated Creatinine Clearance: 79.4 mL/min (A) (by C-G formula based on SCr of 1.04 mg/dL (H)). Liver Function Tests: Recent Labs  Lab 04/02/23 2331  AST 23  ALT 14  ALKPHOS 82  BILITOT 0.6  PROT 8.0  ALBUMIN 3.5   No results for input(s): "LIPASE", "AMYLASE" in the last 168 hours. No results for input(s): "AMMONIA" in the last 168 hours. Coagulation Profile: No results for input(s): "INR", "PROTIME" in the last 168 hours. Cardiac Enzymes: No results for input(s): "CKTOTAL", "CKMB", "CKMBINDEX", "TROPONINI" in the last 168 hours. BNP (last 3 results) No results for input(s): "PROBNP" in the last 8760 hours. HbA1C: No results for input(s): "HGBA1C" in the last 72 hours. CBG: No results for input(s): "GLUCAP" in the last 168 hours. Lipid Profile: No results for input(s): "CHOL",  "HDL", "LDLCALC", "TRIG", "CHOLHDL", "LDLDIRECT" in the last 72 hours. Thyroid Function Tests: No results for input(s): "TSH", "T4TOTAL", "FREET4", "T3FREE", "THYROIDAB" in the last 72 hours. Anemia Panel: No results for input(s): "VITAMINB12", "FOLATE", "FERRITIN", "TIBC", "IRON", "RETICCTPCT" in the last 72 hours. Most Recent Urinalysis On File:     Component Value Date/Time   BILIRUBINUR negative 06/27/2019 1425   PROTEINUR Positive (A) 06/27/2019 1425   UROBILINOGEN 0.2 06/27/2019 1425   NITRITE negative 06/27/2019 1425   LEUKOCYTESUR Negative 06/27/2019 1425   Sepsis Labs: @LABRCNTIP (procalcitonin:4,lacticidven:4) Microbiology: Recent Results (from the past 240 hours)  Resp panel by RT-PCR (RSV, Flu A&B, Covid)  Anterior Nasal Swab     Status: None   Collection Time: 04/02/23  4:18 PM   Specimen: Anterior Nasal Swab  Result Value Ref Range Status   SARS Coronavirus 2 by RT PCR NEGATIVE NEGATIVE Final    Comment: (NOTE) SARS-CoV-2 target nucleic acids are NOT DETECTED.  The SARS-CoV-2 RNA is generally detectable in upper respiratory specimens during the acute phase of infection. The lowest concentration of SARS-CoV-2 viral copies this assay can detect is 138 copies/mL. A negative result does not preclude SARS-Cov-2 infection and should not be used as the sole basis for treatment or other patient management decisions. A negative result may occur with  improper specimen collection/handling, submission of specimen other than nasopharyngeal swab, presence of viral mutation(s) within the areas targeted by this assay, and inadequate number of viral copies(<138 copies/mL). A negative result must be combined with clinical observations, patient history, and epidemiological information. The expected result is Negative.  Fact Sheet for Patients:  BloggerCourse.com  Fact Sheet for Healthcare Providers:  SeriousBroker.it  This test  is no t yet approved or cleared by the [place] FDA and  has been authorized for detection and/or diagnosis of SARS-CoV-2 by FDA under an Emergency Use Authorization (EUA). This EUA will remain  in effect (meaning this test can be used) for the duration of the COVID-19 declaration under Section 564(b)(1) of the Act, 21 U.S.C.section 360bbb-3(b)(1), unless the authorization is terminated  or revoked sooner.       Influenza A by PCR NEGATIVE NEGATIVE Final   Influenza B by PCR NEGATIVE NEGATIVE Final    Comment: (NOTE) The Xpert Xpress SARS-CoV-2/FLU/RSV plus assay is intended as an aid in the diagnosis of influenza from Nasopharyngeal swab specimens and should not be used as a sole basis for treatment. Nasal washings and aspirates are unacceptable for Xpert Xpress SARS-CoV-2/FLU/RSV testing.  Fact Sheet for Patients: BloggerCourse.com  Fact Sheet for Healthcare Providers: SeriousBroker.it  This test is not yet approved or cleared by the [place] FDA and has been authorized for detection and/or diagnosis of SARS-CoV-2 by FDA under an Emergency Use Authorization (EUA). This EUA will remain in effect (meaning this test can be used) for the duration of the COVID-19 declaration under Section 564(b)(1) of the Act, 21 U.S.C. section 360bbb-3(b)(1), unless the authorization is terminated or revoked.     Resp Syncytial Virus by PCR NEGATIVE NEGATIVE Final    Comment: (NOTE) Fact Sheet for Patients: BloggerCourse.com  Fact Sheet for Healthcare Providers: SeriousBroker.it  This test is not yet approved or cleared by the [place] FDA and has been authorized for detection and/or diagnosis of SARS-CoV-2 by FDA under an Emergency Use Authorization (EUA). This EUA will remain in effect (meaning this test can be used) for the duration of the COVID-19 declaration under Section  564(b)(1) of the Act, 21 U.S.C. section 360bbb-3(b)(1), unless the authorization is terminated or revoked.  Performed at [facility], [address], [place], [place] [address]       Radiology Studies last 3 days: CT Angio Chest Pulmonary Embolism (PE) W or WO Contrast Result Date: 04/03/2023 CLINICAL DATA:  Pulmonary embolism (PE) suspected, low to intermediate prob, positive D-dimer EXAM: CT ANGIOGRAPHY CHEST WITH CONTRAST TECHNIQUE: Multidetector CT imaging of the chest was performed using the standard protocol during bolus administration of intravenous contrast. Multiplanar CT image reconstructions and MIPs were obtained to evaluate the vascular anatomy. RADIATION DOSE REDUCTION: This exam was performed according to the departmental dose-optimization program which includes  automated exposure control, adjustment of the mA and/or kV according to patient size and/or use of iterative reconstruction technique. CONTRAST:  OMNIPAQUE IOHEXOL 350 MG/ML SOLN COMPARISON:  None Available. FINDINGS: Cardiovascular: Satisfactory opacification of the pulmonary arteries to the segmental level. No evidence of pulmonary embolism. Normal heart size. No pericardial effusion. Mediastinum/Nodes: No enlarged mediastinal, hilar, or axillary lymph nodes. Thyroid gland, trachea, and esophagus demonstrate no significant findings. Lungs/Pleura: Lungs are clear. No pleural effusion or pneumothorax. Upper Abdomen: No acute abnormality. Musculoskeletal: No chest wall abnormality. No acute or significant osseous findings. Review of the MIP images confirms the above findings. IMPRESSION: No evidence of PE or other acute cardiothoracic process. Electronically Signed   By: Layla Maw M.D.   On: 04/03/2023 00:49   DG Chest 2 View Result Date: 04/02/2023 CLINICAL DATA:  Cough and cold symptoms EXAM: CHEST - 2 VIEW COMPARISON:  05/14/2021 FINDINGS: The heart size and mediastinal contours are within normal  limits. Both lungs are clear. The visualized skeletal structures are unremarkable. IMPRESSION: No active cardiopulmonary disease. Electronically Signed   By: Minerva Fester M.D.   On: 04/02/2023 19:26        Sunnie Nielsen, DO [facility] 04/04/2023, 2:22 PM    Dictation software may have been used to generate the above note. Typos may occur and escape review in typed/dictated notes. Please contact Dr Lyn Hollingshead directly for clarity if needed.  Staff may message me via secure chat in Epic  but this may not receive an immediate response,  please page me for urgent matters!  If 7PM-7AM, please contact night coverage [email or web address]

## 2023-04-04 NOTE — Plan of Care (Signed)
Attempted to ambulate in hall, just to restroom on RA. SPO2 remained 92 or higher, but only got half way, RR mid 40s and dizzy/lightheaded. RN had to yell for help and bear-hug patient get her into WC.  Patient had near syncope episode and nearly fell to the floor.  Dr. informed

## 2023-04-04 NOTE — ED Notes (Signed)
Answered call light, pt advised she needs to go to the bathroom. Assisted pt getting out of the bed, offered to take pt in the wheel chair, pt states she is going to try to ambulate. Pt ambulated to bathroom with no issues, advised to pull call bell in bathroom if she needs assistance.

## 2023-04-04 NOTE — Consult Note (Signed)
PHARMACIST - PHYSICIAN COMMUNICATION   CONCERNING: Methylprednisolone IV    Current order: Methylprednisolone IV 40 BID     DESCRIPTION: Per [place] Protocol:   IV methylprednisolone will be converted to either a q12h or q24h frequency with the same total daily dose (TDD).  Ordered Dose: 1 to 125 mg TDD; convert to: TDD q24h.  Ordered Dose: 126 to 250 mg TDD; convert to: TDD div q12h.  Ordered Dose: >250 mg TDD; DAW.  Order has been adjusted to: Methylprednisolone IV 80 mg daily  Celene Squibb, PharmD Clinical Pharmacist 04/04/2023 1:20 PM

## 2023-04-05 ENCOUNTER — Encounter: Payer: Self-pay | Admitting: Family Medicine

## 2023-04-05 DIAGNOSIS — J209 Acute bronchitis, unspecified: Secondary | ICD-10-CM | POA: Diagnosis not present

## 2023-04-05 MED ORDER — DOXYCYCLINE HYCLATE 100 MG PO TABS: 100.0000 mg | ORAL_TABLET | Freq: Two times a day (BID) | ORAL | 0 refills | Status: AC

## 2023-04-05 MED ORDER — DOXYCYCLINE HYCLATE 100 MG PO TABS
100.0000 mg | ORAL_TABLET | Freq: Two times a day (BID) | ORAL | Status: DC
Start: 2023-04-05 — End: 2023-04-05
  Administered 2023-04-05: 100 mg via ORAL
  Filled 2023-04-05: qty 1

## 2023-04-05 NOTE — Plan of Care (Signed)
?  Problem: Clinical Measurements: ?Goal: Respiratory complications will improve ?Outcome: Progressing ?  ?Problem: Activity: ?Goal: Risk for activity intolerance will decrease ?Outcome: Progressing ?  ?Problem: Nutrition: ?Goal: Adequate nutrition will be maintained ?Outcome: Progressing ?  ?Problem: Coping: ?Goal: Level of anxiety will decrease ?Outcome: Progressing ?  ?

## 2023-04-05 NOTE — TOC Initial Note (Signed)
Transition of Care [facility]) - Initial/Assessment Note    Patient Details  Name: Christina Richards MRN: [id number] Date of Birth: [birth year]  [facility]) CM/SW Contact:    Margarito Liner, LCSW Phone Number: 04/05/2023, 12:59 PM  Clinical Narrative:  CSW met with patient. Roommate and a female individual were at bedside but they left prior to assessment. CSW introduced role and inquired about homelessness. Patient has been staying with the roommate temporarily and is unsure how long she can stay there. She did confirm plan to return there at discharge. Patient reported owing bills for gas, [employer], etc. CSW will add resources to her AVS. She is having her payee handle her bills. Patient stated she cannot go to a shelter because of her mental health issues but CSW provided their contact information as a last resort. Patient voiced feelings of depression but confirmed no SI/HI. RN is aware. No further concerns. CSW encouraged patient to contact CSW as needed. CSW will continue to follow patient for support and facilitate return home once stable. Her roommate will pick her up at discharge.  Expected Discharge Plan: Home/Self Care Barriers to Discharge: Continued Medical Work up   Patient Goals and CMS Choice            Expected Discharge Plan and Services     Post Acute Care Choice: NA Living arrangements for the past 2 months: Single Family Home                                      Prior Living Arrangements/Services Living arrangements for the past 2 months: Single Family Home Lives with:: Roommate Patient language and need for interpreter reviewed:: Yes Do you feel safe going back to the place where you live?: Yes      Need for Family Participation in Patient Care: Yes (Comment) Care giver support system in place?: Yes (comment)   Criminal Activity/Legal Involvement Pertinent to Current Situation/Hospitalization: No - Comment as needed  Activities of Daily  Living   ADL Screening (condition at time of admission) Independently performs ADLs?: Yes (appropriate for developmental age) Is the patient deaf or have difficulty hearing?: No Does the patient have difficulty seeing, even when wearing glasses/contacts?: No Does the patient have difficulty concentrating, remembering, or making decisions?: No  Permission Sought/Granted                  Emotional Assessment Appearance:: Appears stated age Attitude/Demeanor/Rapport: Engaged Affect (typically observed): Accepting, Pleasant, Tearful/Crying Orientation: : Oriented to Self, Oriented to Place, Oriented to  Time, Oriented to Situation Alcohol / Substance Use: Not Applicable Psych Involvement: No (comment)  Admission diagnosis:  Acute bronchitis with bronchospasm [J20.9] Hypoxia [R09.02] Acute bronchitis, unspecified organism [J20.9] Patient Active Problem List   Diagnosis Date Noted   Acute bronchitis with bronchospasm 04/03/2023   Chest pain 04/03/2023   GERD without esophagitis 04/03/2023   Essential hypertension 04/03/2023   Dyslipidemia 04/03/2023   Hypoxemia 04/03/2023   Hypokalemia 04/03/2023   Myofascial pain dysfunction syndrome 09/24/2021   Fibromyalgia syndrome 08/19/2021   Bilateral primary osteoarthritis of knee 08/19/2021   Obesity, morbid (HCC) 05/19/2021   Depression, recurrent (HCC) 11/01/2019   Encounter for screening mammogram for malignant neoplasm of breast 11/01/2019   Stress incontinence of urine 10/11/2019   Encounter for general adult medical examination with abnormal findings 06/28/2019   Chronic otitis externa of both ears 06/28/2019  Irritable bowel syndrome with both constipation and diarrhea 06/28/2019   Exposure to STD 06/28/2019   Routine cervical smear 06/28/2019   Dysuria 06/28/2019   Illicit drug use 06/28/2019   Hyperlipidemia 01/19/2019   Depression, major, single episode, moderate (HCC) 01/19/2019   Cocaine dependence without  complication (HCC) 01/19/2019   Chronic low back pain without sciatica 07/25/2017   Acute vaginitis 05/05/2017   Candidiasis of vulva and vagina 05/05/2017   Acute sinusitis, unspecified 05/05/2017   Essential (primary) hypertension 05/05/2017   Body mass index 45.0-49.9, adult (HCC) 05/05/2017   Status post rotator cuff repair 07/17/2014   Complete tear of right rotator cuff 01/04/2014   PCP:  [facility] Pharmacy:   [facility] - [place], [place] - [address] [place] [address] Phone: [phone] Fax: [phone]     Social Drivers of Health (SDOH) Social History: SDOH Screenings   Food Insecurity: No Food Insecurity (04/04/2023)  Housing: High Risk (04/04/2023)  Transportation Needs: Unmet Transportation Needs (04/04/2023)  Utilities: Not At Risk (04/04/2023)  Alcohol Screen: Low Risk  (11/17/2018)  Depression (PHQ2-9): Low Risk  (09/24/2021)  Financial Resource Strain: Low Risk  (02/21/2019)  Physical Activity: Inactive (02/21/2019)  Social Connections: Unknown (02/21/2019)  Stress: Stress Concern Present (02/21/2019)  Tobacco Use: High Risk (04/05/2023)   SDOH Interventions: Housing Interventions: Inpatient TOC, Other (Comment) (Resources added to AVS.) Transportation Interventions: Inpatient TOC, Other (Comment) (Resources added to AVS.)   Readmission Risk Interventions     No data to display

## 2023-04-05 NOTE — Discharge Summary (Signed)
Christina Richards XBM:[id number] DOB: 06-20-1960 DOA: 04/02/2023  PCP: [facility]  Admit date: 04/02/2023 Discharge date: 04/05/2023  Time spent: 35 minutes  Recommendations for Outpatient Follow-up:  Close pcp f/u     Discharge Diagnoses:  Principal Problem:   Acute bronchitis with bronchospasm Active Problems:   Chest pain   Hypokalemia   GERD without esophagitis   Essential hypertension   Dyslipidemia   Hypoxemia   Discharge Condition: improved  Diet recommendation: heart healthy  Filed Weights   04/02/23 1638  Weight: 131.1 kg    History of present illness:  From admission h and p Christina Richards is a 62 y.o. female with medical history significant for anxiety, osteoarthritis, bipolar disorder, depression, GERD, hypertension and dyslipidemia as well as sleep apnea, who presented to emergency room with acute onset of cough and right otalgia for the last 4 days.  She has tried NyQuil and Alka-Seltzer with no relief.  She denied any fever or chills.  She admitted to chest pain and that she graded 3-4/10 in severity that is sharp and worsening with cough.  She stated that it feels like a shock all over her body.  She admitted to associated nausea without vomiting.  abdominal pain.  She has been having a constellation of symptoms similar to her URI and bronchitis that she had in the past.  No headache or dizziness or blurred vision.  No dysuria, oliguria or hematuria or flank pain.  No chest pain or palpitations.  No bleeding diathesis   Hospital Course:  Patient presents with cough and dyspnea. Covid/flu/rsv negative. Cxr clear, CTA negative for pe or other acute process. Patient was however dyspneic and hypoxic requiring 2 liters so was admitted for acute bronchitis and started on doxycycline. She improved and oxygen has been weaned. She declined PT consulted but is ambulating around her room without difficulty or hypoxia and requests discharge. We will  complete a 5 day course of doxycycline. Patient reports she has a place to stay but has recently experienced homelessness - our Child psychotherapist provided her with housing resources.   Procedures: none   Consultations: none  Discharge Exam: Vitals:   04/05/23 1253 04/05/23 1512  BP: 138/66   Pulse: 72   Resp:    Temp:    SpO2:  99%    General: NAD Cardiovascular: RRR Respiratory: CTAB Abdomen: soft, non-tender Ext: no edema  Discharge Instructions   Discharge Instructions     Diet - low sodium heart healthy   Complete by: As directed    Increase activity slowly   Complete by: As directed       Allergies as of 04/05/2023       Reactions   Ibuprofen Nausea Only        Medication List     STOP taking these medications    levocetirizine 5 MG tablet Commonly known as: XYZAL       TAKE these medications    atorvastatin 20 MG tablet Commonly known as: LIPITOR Take 20 mg by mouth at bedtime.   azelastine 0.05 % ophthalmic solution Commonly known as: OPTIVAR Place 1 drop into both eyes 2 (two) times daily. What changed:  when to take this reasons to take this   brompheniramine-pseudoephedrine-DM 30-2-10 MG/5ML syrup Take 5 mLs by mouth 3 (three) times daily as needed.   cetirizine 10 MG tablet Commonly known as: ZYRTEC Take 10 mg by mouth daily.   diclofenac Sodium 1 % Gel Commonly known  as: VOLTAREN Apply 1 Application topically 4 (four) times daily as needed (pain).   diltiazem 360 MG 24 hr capsule Commonly known as: TIAZAC Take 1 capsule (360 mg total) by mouth daily.   diphenhydrAMINE 25 MG tablet Commonly known as: BENADRYL Take 50 mg by mouth daily as needed for itching.   doxycycline 100 MG tablet Commonly known as: VIBRA-TABS Take 1 tablet (100 mg total) by mouth every 12 (twelve) hours for 3 days.   Flovent HFA 110 MCG/ACT inhaler Generic drug: fluticasone Inhale 2 puffs into the lungs 2 (two) times daily as needed (shortness of  breath).   guaiFENesin 600 MG 12 hr tablet Commonly known as: MUCINEX Take 600 mg by mouth daily.   omeprazole 20 MG capsule Commonly known as: PRILOSEC Take 20 mg by mouth daily as needed (acid reflux).   polyethylene glycol powder 17 GM/SCOOP powder Commonly known as: GLYCOLAX/MIRALAX Take 17 g by mouth daily.   pregabalin 50 MG capsule Commonly known as: Lyrica Take 1 capsule (50 mg total) by mouth 2 (two) times daily. X 1 week, then 1 tab in Am and 2 tabs at night x 1 week, then 100 mg BID- for fibromyalgia.   Proventil HFA 108 (90 Base) MCG/ACT inhaler Generic drug: albuterol Inhale 2 puffs into the lungs every 4 (four) hours as needed.   RA Pads for Women Misc Use      Use 3-4 bed incontinence pads every time napping or sleeping, uses 8-10 in a day R32   Symbicort 160-4.5 MCG/ACT inhaler Generic drug: budesonide-formoterol Inhale 2 puffs into the lungs 2 (two) times daily.       Allergies  Allergen Reactions   Ibuprofen Nausea Only    Follow-up Information     [facility] Follow up.   Specialty: General Practice Contact information: [address] [place] [address] [phone]                  The results of significant diagnostics from this hospitalization (including imaging, microbiology, ancillary and laboratory) are listed below for reference.    Significant Diagnostic Studies: CT Angio Chest Pulmonary Embolism (PE) W or WO Contrast Result Date: 04/03/2023 CLINICAL DATA:  Pulmonary embolism (PE) suspected, low to intermediate prob, positive D-dimer EXAM: CT ANGIOGRAPHY CHEST WITH CONTRAST TECHNIQUE: Multidetector CT imaging of the chest was performed using the standard protocol during bolus administration of intravenous contrast. Multiplanar CT image reconstructions and MIPs were obtained to evaluate the vascular anatomy. RADIATION DOSE REDUCTION: This exam was performed according to the departmental  dose-optimization program which includes automated exposure control, adjustment of the mA and/or kV according to patient size and/or use of iterative reconstruction technique. CONTRAST:  OMNIPAQUE IOHEXOL 350 MG/ML SOLN COMPARISON:  None Available. FINDINGS: Cardiovascular: Satisfactory opacification of the pulmonary arteries to the segmental level. No evidence of pulmonary embolism. Normal heart size. No pericardial effusion. Mediastinum/Nodes: No enlarged mediastinal, hilar, or axillary lymph nodes. Thyroid gland, trachea, and esophagus demonstrate no significant findings. Lungs/Pleura: Lungs are clear. No pleural effusion or pneumothorax. Upper Abdomen: No acute abnormality. Musculoskeletal: No chest wall abnormality. No acute or significant osseous findings. Review of the MIP images confirms the above findings. IMPRESSION: No evidence of PE or other acute cardiothoracic process. Electronically Signed   By: Layla Maw M.D.   On: 04/03/2023 00:49   DG Chest 2 View Result Date: 04/02/2023 CLINICAL DATA:  Cough and cold symptoms EXAM: CHEST - 2 VIEW COMPARISON:  05/14/2021  FINDINGS: The heart size and mediastinal contours are within normal limits. Both lungs are clear. The visualized skeletal structures are unremarkable. IMPRESSION: No active cardiopulmonary disease. Electronically Signed   By: Minerva Fester M.D.   On: 04/02/2023 19:26    Microbiology: Recent Results (from the past 240 hours)  Resp panel by RT-PCR (RSV, Flu A&B, Covid) Anterior Nasal Swab     Status: None   Collection Time: 04/02/23  4:18 PM   Specimen: Anterior Nasal Swab  Result Value Ref Range Status   SARS Coronavirus 2 by RT PCR NEGATIVE NEGATIVE Final    Comment: (NOTE) SARS-CoV-2 target nucleic acids are NOT DETECTED.  The SARS-CoV-2 RNA is generally detectable in upper respiratory specimens during the acute phase of infection. The lowest concentration of SARS-CoV-2 viral copies this assay can detect is 138  copies/mL. A negative result does not preclude SARS-Cov-2 infection and should not be used as the sole basis for treatment or other patient management decisions. A negative result may occur with  improper specimen collection/handling, submission of specimen other than nasopharyngeal swab, presence of viral mutation(s) within the areas targeted by this assay, and inadequate number of viral copies(<138 copies/mL). A negative result must be combined with clinical observations, patient history, and epidemiological information. The expected result is Negative.  Fact Sheet for Patients:  BloggerCourse.com  Fact Sheet for Healthcare Providers:  SeriousBroker.it  This test is no t yet approved or cleared by the [place] FDA and  has been authorized for detection and/or diagnosis of SARS-CoV-2 by FDA under an Emergency Use Authorization (EUA). This EUA will remain  in effect (meaning this test can be used) for the duration of the COVID-19 declaration under Section 564(b)(1) of the Act, 21 U.S.C.section 360bbb-3(b)(1), unless the authorization is terminated  or revoked sooner.       Influenza A by PCR NEGATIVE NEGATIVE Final   Influenza B by PCR NEGATIVE NEGATIVE Final    Comment: (NOTE) The Xpert Xpress SARS-CoV-2/FLU/RSV plus assay is intended as an aid in the diagnosis of influenza from Nasopharyngeal swab specimens and should not be used as a sole basis for treatment. Nasal washings and aspirates are unacceptable for Xpert Xpress SARS-CoV-2/FLU/RSV testing.  Fact Sheet for Patients: BloggerCourse.com  Fact Sheet for Healthcare Providers: SeriousBroker.it  This test is not yet approved or cleared by the [place] FDA and has been authorized for detection and/or diagnosis of SARS-CoV-2 by FDA under an Emergency Use Authorization (EUA). This EUA will remain in effect (meaning  this test can be used) for the duration of the COVID-19 declaration under Section 564(b)(1) of the Act, 21 U.S.C. section 360bbb-3(b)(1), unless the authorization is terminated or revoked.     Resp Syncytial Virus by PCR NEGATIVE NEGATIVE Final    Comment: (NOTE) Fact Sheet for Patients: BloggerCourse.com  Fact Sheet for Healthcare Providers: SeriousBroker.it  This test is not yet approved or cleared by the [place] FDA and has been authorized for detection and/or diagnosis of SARS-CoV-2 by FDA under an Emergency Use Authorization (EUA). This EUA will remain in effect (meaning this test can be used) for the duration of the COVID-19 declaration under Section 564(b)(1) of the Act, 21 U.S.C. section 360bbb-3(b)(1), unless the authorization is terminated or revoked.  Performed at [facility], [address] [place], [place] [address]      Labs: Basic Metabolic Panel: Recent Labs  Lab 04/02/23 2331 04/03/23 0539  NA 137 135  K 3.2* 3.9  CL 97* 99  CO2 28 26  GLUCOSE 112* 162*  BUN 15 19  CREATININE 1.09* 1.04*  CALCIUM 9.0 8.6*   Liver Function Tests: Recent Labs  Lab 04/02/23 2331  AST 23  ALT 14  ALKPHOS 82  BILITOT 0.6  PROT 8.0  ALBUMIN 3.5   No results for input(s): "LIPASE", "AMYLASE" in the last 168 hours. No results for input(s): "AMMONIA" in the last 168 hours. CBC: Recent Labs  Lab 04/02/23 2331 04/03/23 0539  WBC 3.8* 3.0*  NEUTROABS 2.7  --   HGB 13.6 12.6  HCT 41.1 38.0  MCV 86.9 86.0  PLT 205 185   Cardiac Enzymes: No results for input(s): "CKTOTAL", "CKMB", "CKMBINDEX", "TROPONINI" in the last 168 hours. BNP: BNP (last 3 results) No results for input(s): "BNP" in the last 8760 hours.  ProBNP (last 3 results) No results for input(s): "PROBNP" in the last 8760 hours.  CBG: No results for input(s): "GLUCAP" in the last 168 hours.     Signed:  Silvano Bilis MD.   [facility] 04/05/2023, 4:47 PM

## 2023-04-05 NOTE — Progress Notes (Signed)
Mobility Specialist - Progress Note   04/05/23 1100  Mobility  Activity Contraindicated/medical hold     Pt sitting in bed upon arrival, utiliizing RA. Attempted to get O2 ambulation test on patient, however pt reporting dizziness at beginning of session. BP checked 156/102, rechecked 172/101. O2 high 90s with HR 80-90s bpm. Session concluded. RN made aware. Pt left in bed with alarm set, needs in reach.    Christina Richards Mobility Specialist 04/05/23, 11:41 AM

## 2023-04-05 NOTE — Evaluation (Signed)
Physical Therapy Evaluation Patient Details Name: Christina Richards MRN: [id number] DOB: June 22, 1960 Today's Date: 04/05/2023  History of Present Illness  Pt is 62 y/o admitted 04/02/23 for acute bronchitis with bronchospasm. PMHx includes but is not limited to: HTN and chest pains.   Clinical Impression  Pt received in bed and agreed to PT session. Pt reported that she had a headache on a pain scale of 8/10 in addition to itchy eyes, RN notified. Pt performed bed mobility SUP and declined further mobility so returned back to bed. During Hx intake, pt began to express the lack of familial support that she has been dealing with as well as other external factors that she has to think about. Due to this, pt got emotional and declined further mobility as she stated that she just wanted to lay down and be to herself. Pt expressed interest in participating further in PT session tomorrow where further mobility should be assessed. Pt did not tolerated Tx well and will continue to benefit from skilled PT sessions to improve functional mobility and activity tolerance to maximize safety/IND following D/C.        If plan is discharge home, recommend the following: A lot of help with walking and/or transfers;Help with stairs or ramp for entrance;Assist for transportation   Can travel by private vehicle        Equipment Recommendations Other (comment) (TBD)  Recommendations for Other Services       Functional Status Assessment Patient has had a recent decline in their functional status and demonstrates the ability to make significant improvements in function in a reasonable and predictable amount of time.     Precautions / Restrictions Precautions Precautions: Fall Restrictions Weight Bearing Restrictions Per Provider Order: No      Mobility  Bed Mobility Overal bed mobility: Needs Assistance Bed Mobility: Sit to Supine, Supine to Sit     Supine to sit: Supervision Sit to supine: Supervision    General bed mobility comments: Pt perfored bed mobility SUP and reported continued presence of headache    Transfers                   General transfer comment: Not observed    Ambulation/Gait               General Gait Details: Not observed  Stairs            Wheelchair Mobility     Tilt Bed    Modified Rankin (Stroke Patients Only)       Balance Overall balance assessment: Needs assistance Sitting-balance support: Feet supported Sitting balance-Leahy Scale: Good         Standing balance comment: not observed                             Pertinent Vitals/Pain Pain Assessment Pain Assessment: 0-10 Pain Score: 8  Pain Location: headache Pain Descriptors / Indicators: Throbbing Pain Intervention(s): Limited activity within patient's tolerance, Monitored during session    Home Living Family/patient expects to be discharged to:: Private residence Living Arrangements: Non-relatives/Friends Available Help at Discharge: Other (Comment) (Pt reports not having any support) Type of Home: Apartment Home Access: Stairs to enter Entrance Stairs-Rails: Left (When ascending stairs) Entrance Stairs-Number of Steps: 2 flights   Home Layout: One level        Prior Function Prior Level of Function : Independent/Modified Independent  Mobility Comments: Pt reports IND prior to admission ADLs Comments: Pt reports IND prior to admission. Pt states that due to fear of falling, she has been taking bathes and if she has to stand to wash her hair she will lean against the shower wall.     Extremity/Trunk Assessment   Upper Extremity Assessment Upper Extremity Assessment: Overall WFL for tasks assessed    Lower Extremity Assessment Lower Extremity Assessment: Overall WFL for tasks assessed       Communication   Communication Communication: No apparent difficulties Cueing Techniques: Verbal cues  Cognition Arousal:  Alert Behavior During Therapy: WFL for tasks assessed/performed Overall Cognitive Status: Within Functional Limits for tasks assessed                                 General Comments: AOx4. Pt pleasant and willing to participate in session initially. Due to lack of familial support pt began to get emotional during session which caused her to decline further mobility.        General Comments      Exercises     Assessment/Plan    PT Assessment Patient needs continued PT services  PT Problem List Decreased activity tolerance;Decreased mobility;Pain       PT Treatment Interventions DME instruction;Gait training;Functional mobility training;Stair training;Therapeutic activities;Therapeutic exercise    PT Goals (Current goals can be found in the Care Plan section)  Acute Rehab PT Goals Patient Stated Goal: To go home PT Goal Formulation: With patient Time For Goal Achievement: 04/19/23 Potential to Achieve Goals: Good    Frequency Min 1X/week     Co-evaluation               AM-PAC PT "6 Clicks" Mobility  Outcome Measure Help needed turning from your back to your side while in a flat bed without using bedrails?: None Help needed moving from lying on your back to sitting on the side of a flat bed without using bedrails?: None Help needed moving to and from a bed to a chair (including a wheelchair)?: A Little Help needed standing up from a chair using your arms (e.g., wheelchair or bedside chair)?: A Little Help needed to walk in hospital room?: A Little Help needed climbing 3-5 steps with a railing? : A Lot 6 Click Score: 19    End of Session   Activity Tolerance: Other (comment);Patient limited by pain (Limited due to emotional state) Patient left: in bed;with call bell/phone within reach Nurse Communication: Mobility status;Patient requests pain meds PT Visit Diagnosis: Other abnormalities of gait and mobility (R26.89);Difficulty in walking, not  elsewhere classified (R26.2)    Time: 1610-9604 PT Time Calculation (min) (ACUTE ONLY): 15 min   Charges:   PT Evaluation $PT Eval Moderate Complexity: 1 Mod   PT General Charges $$ ACUTE PT VISIT: 1 Visit         Christina Richards SPT, LAT, ATC  Christina Richards 04/05/2023, 3:13 PM

## 2023-04-05 NOTE — Discharge Instructions (Addendum)
Rent/Utility/Housing  Agency Name: [facility] Agency Address: [address] [place], [place] [address] Phone: [phone] Email: [email or web address] Website: [email or web address] Service(s) Offered: Housing services, self-sufficiency, congregate meal program, weatherization program, Field seismologist program, emergency food assistance,  housing counseling, home ownership program, wheels -towork program.  Agency Name: [facility] Address: [address], [place], [place] [address] Phone: [phone] (8a-4p) [phone] (8p- 10p) Email: [email or web address] Website: [email or web address] Service(s) Offered: A program for homeless and/or needy men that includes one-on-one counseling, life skills training and job rehabilitation.  Agency Name: [facility] [place] Address: [address] [place], [place] [address] Phone: [phone] Website: www.[email or web address].org Service(s) Offered: Assistance to needy in emergency with utility bills, heating fuel, and prescriptions. Shelter for homeless 7pm-7am. August 13, 2016 15  Agency Name: [facility] [place] [facility]Developmentally Disabled) Address: [address], [place], [place] [address] Phone: [phone] Contact Person: Cathleen Corti Email: [email or web address] Website: [email or web address] Service(s) Offered: Helps individuals with developmental disabilities move from housing that is more restrictive to homes where they  can achieve greater independence and have more  opportunities.  Agency Name: [facility] Address: [address], [place], [place] [address] Phone: [phone] Email: [email or web address] Website: www.[email or web address].org Service(s) Offered: Provides affordable housing for low-income families, elderly, and disabled individuals. Offer a wide range of  programs and services, from financial planning to  afterschool and summer programs.  Agency Name: [facility] Address: [address] [place], [place] [address] Phone: [phone] Service(s) Offered: Child support services; child welfare services; food stamps; Medicaid; work first family assistance; and aid with fuel,  rent, food and medicine.  Agency Name: [facility] [place] [employer]. Address: [facility] [address] [place], [place]  [address] Phone: [phone] Website: www.familyabuseservices.org Service(s) Offered: 24 hour Crisis Line: [phone]; 24 hour Emergency Shelter; Transitional Housing; Support Groups; Scientist, physiological; [employer]; Hispanic Outreach: [phone];  Visitation Center: [phone].  Agency Name: [facility] [place]. Address: [address], [place], [place] [address] Phone: [phone] Service(s) Offered: CAP Services; Home and [employer]; Individual or Group Supports; Respite Care Non-Institutional Nursing;  Residential Supports; Respite Care and Personal Care Services; Transportation; Family and Friends Night; Recreational Activities; Three Nutritious Meals/Snacks; Consultation with Registered Dietician; Twenty-four hour Registered Nurse Access; Daily and [employer]; Camp Green Leaves; [place] for the [employer] (During Summer Months) Bingo Night (Every  Wednesday Night); Special Populations Dance Night  (Every Tuesday Night); Professional Hair Care Services.  Agency Name: [facility] Address: [address], [place], [place] [address] Phone: [phone] Contact Person: Jabier Mutton Website: [email or web address].Physicist, medical) Offered: Residential treatment facility for women; food and  clothing, educational & employment development and  transportation to work; Counsellor of financial skills;  parenting and family reunification; emotional and spiritual  support;  transitional housing for program graduates.  Agency Name: [facility] Address: [address], [place], [place] [address] Phone: [phone] Email: [email or web address] Website: [email or web address].es Service(s) Offered: Public housing units for elderly, disabled, and low income people; housing choice vouchers for income eligible  applicants; shelter plus care vouchers; and Psychologist, clinical.  Agency Name: [facility] [employer] Address: [address], [place], [place] [address] Phone: [phone] Email: [email or web address] Website: www.habitatalamance.org Service(s) Offered: Build houses for families in need of decent housing. Each adult in the family must invest 200 hours of labor on  someone else's house, work with volunteers to build their own house, attend classes  on budgeting, home maintenance, yard care, and attend homeowner association meetings.  Agency Name: [facility] Address: [address], [place], [place] [address] Phone: [phone] Website: www.rsli.org Service(s) Offered: Intermediate care facilities for intellectually delayed, Supervised Living in group homes for adults with developmental disabilities, Supervised Living for people who have dual diagnoses (MRMI), Independent Living, Supported Living, respite and a variety of CAP services, pre-vocational services, day supports, and [employer].  Agency Name: [place] [facility] Phone: [phone] Website: www.NCForeclosurePrevention.gov Service(s) Offered: Zero-interest, deferred loans to homeowners struggling to pay their mortgage. Call for more information.  Transportation Resources  Agency Name: [facility] Agency Address: [address] [place], [place] [address] Phone: [phone] Email: [email or web address] Website: www.[email or web address].org Service(s) Offered: Housing services, self-sufficiency, congregate  meal program, weatherization program, Field seismologist program, emergency food assistance,  housing counseling, home ownership program, wheels-towork program.  Agency Name: [facility] [employer] [phone]) Address: [address] [place], [place] [address] Phone: [phone] Website: www.acta-[email or web address].[email or web address] Service(s) Offered: Transportation for [employer], subscription and demand response; Dial-a-Ride for citizens 72 years of age or older.  Agency Name: Department of Social Services Address: [address] [place], [place] [address] Phone: [phone] Service(s) Offered: Child support services; child welfare services; food stamps; Medicaid; work first family assistance; and aid with fuel,  rent, food and medicine, transportation assistance.  Agency Name: Disabled [employer] (DAV) Transportation  Network Phone: [phone] Service(s) Offered: Transports veterans to the [facility] medical center. Call  forty-eight hours in advance and leave the name, telephone  number, date, and time of appointment. Veteran will be  contacted by the driver the day before the appointment to  arrange a pick up point   Transportation Resources  Agency Name: [facility] Agency Address: [address] [place], [place] [address] Phone: [phone] Email: [email or web address] Website: www.[email or web address] Service(s) Offered: Housing services, self-sufficiency, congregate meal program, weatherization program, Field seismologist program, emergency food assistance,  housing counseling, home ownership program, wheels-towork program.  Agency Name: [facility] [employer] [phone]) Address: [address] [place], [place] [address] Phone: [phone] Website: www.acta-.com Service(s) Offered: Transportation for [employer], subscription and demand response; Dial-a-Ride for citizens 60 years  of age or older.  Agency Name: Department of Social Services Address: [address] [place], [place] [address] Phone: [phone] Service(s) Offered: Child support services; child welfare services; food stamps; Medicaid; work first family assistance; and aid with fuel,  rent, food and medicine, transportation assistance.  Agency Name: Disabled [employer] (DAV) Transportation  Network Phone: [phone] Service(s) Offered: Transports veterans to the [facility] medical center. Call  forty-eight hours in advance and leave the name, telephone  number, date, and time of appointment. Veteran will be  contacted by the driver the day before the appointment to  arrange a pick up point    [employer] ACTA currently provides door to door services. ACTA connects with PART daily for services to [facility]. ACTA also performs contract services to [employer] operates 27 vehicles, all but 3 mini-vans are equipped with lifts for special needs as well as the general public. ACTA drivers are each CDL certified and trained in First Aid and CPR. ACTA was established in 2002 by [employer]. An independent Industrial/product designer. ACTA operates via Cytogeneticist with required Research scientist (physical sciences) from [place]. ACTA provides over 80,000 passenger trips each year, including [facility]  Adult Day Services and [employer] sites.  Call at least by 11 AM one business day prior to needing transportation  [employer].                      [place], [place] [address]     Office Hours: Monday-Friday  8 AM - 5 PM  Agency Name: [facility] Agency Address: [address] [place], [place] [address] Phone: [phone] Website: www.alamanceservices.org Service(s) Offered: Housing services, self-sufficiency, congregate meal program, and individual development  account program.  Agency Name: [facility] [place] Address: [address] [place], [place] [address] Phone: [phone] Email: [email or web address] Website: www.alliedchurches.org Service(s) Offered: Housing the homeless, feeding the hungry, Company secretary, job and education related services.  Agency Name: [facility] Address: [address] [place], [place] [address] Phone: [phone] Email: [email or web address] Service(s) Offered: Counseling, problem pregnancy, advocacy for Hispanics, limited emergency financial assistance.  Agency Name: Department of Social Services Address: [address] [place], [place] [address] Phone: [phone] Website: [email or web address] Service(s) Offered: Child support services; child welfare services; SNAP; Medicaid; work first family assistance; and aid with fuel,  rent, food and medicine.  Agency Name: [facility] Address: [address], [place], [place] [address] Phone: [phone] or [phone] Email: [email or web address] Service(s) Offered: Family services and transient assistance; emergency food, fuel, clothing, limited furniture, utilities; budget counseling, general counseling; give a kid a coat; thrift store; Christmas food and toys. Utility assistance, food pantry, rental  assistance, life sustaining medicine   Food Resources  Agency Name: [facility] Agency Address: [address] [place], [place] [address] Phone: [phone] Website: www.alamanceservices.org Service(s) Offered: Housing services, self-sufficiency, congregate meal program, weatherization program, Event organiser program, emergency food assistance,  housing counseling, home ownership program, wheels - to work program.  Dole Food free for 60 and older at various locations from [employer], Monday-Friday:  [employer], [address] [place],  [phone] -[facility], [address] [place] [phone]  -[facility], [address], [place] [phone]  -[address], [address] [place], [phone]  Agency Name: [facility] on [facility] Address: [phone] [address], [place], [place] [address] Phone: [phone] Website: www.alamancemow.org Service(s) Offered: Home delivered hot, frozen, and emergency  meals. Grocery assistance program which matches  volunteers one-on-one with seniors unable to grocery shop  for themselves. Must be 60 years and older; less than 20  hours of in-home aide service, limited or no driving ability;  live alone or with someone with a disability; live in  [place].  Agency Name: [facility]) Address: [address], [place], [place] [address] Phone: [phone] Service(s) Offered: Food is served to shut-ins, homeless, elderly, and low income people in the community every Saturday (11:30 am-12:30 pm) and Sunday (12:30 pm-1:30pm). Volunteers also offer help and encouragement in seeking employment,  and spiritual guidance.  Agency Name: Department of Social Services Address: [address] [place], [place] [address] Phone: [phone] Service(s) Offered: Child support services; child welfare services; food stamps; Medicaid; work first family assistance; and aid with fuel,  rent, food and medicine.  Agency Name: [facility] Address: [address], [place], [place] Phone: [phone] Website: www.[email or web address] Services Offered: Monday 10:00am-12:00, 8:00pm-9:00pm, and Friday 10:00am-12:00.  Agency Name: [facility] [place] Address: [address] [place], [place] [address] Phone: [phone] Website: www.alliedchurches.org Service(s) Offered: Serves weekday meals, open from 11:30  am- 1:00 pm., and 6:30-7:30pm, Monday-Wednesday-Friday distributes food  3:30-6pm, Monday-Wednesday-Friday.  Agency Name: [facility] Address: [address] [place], [place] Phone: [phone] Website: [email or web address].[email or web address].[email or web address] Services Offered: Distributes food the 4th Saturday of the month, starting at 8:00 am  Agency Name: [facility] Address: [phone] [place], [place] [address] Phone: [phone] Website: [email or web address] Service(s) Offered: Bread of life, weekly food pantry. Open Wednesdays from 10:00am-noon.  Agency Name: [facility] [employer] Address: [address] [place], [place], [place] Phone: [phone] Services Offered: Distributes food 9am-1pm, Monday-Thursday. Call for details.  Agency Name: [facility] Address: [address] [place], [place] [address] Phone: [phone] Website: firstbaptistburlington.[email or web address] Service(s) Offered: Games developer. Call for assistance.  Agency Name: [facility] Address: [address], [place], [place] [address] Phone: [phone] Service Offered: Emergency Food Pantry. Call for appointment.  Agency Name: [facility] Address: [address] [place], [place] [address] Phone: [phone] Website: [email or web address] Services Offered: Games developer. Call for details  Agency Name: [facility] Address: [address] [place], [place] Phone: [phone] Website: [email or web address] Service(s) Offered: Emergency Food Pantry. Call for details.  Agency Name: [facility] Address: [address] [place], [place] [address] Phone: [phone] or [phone] Website: [email or web address] Service(s) Offered: Distribute food 9am-11:30 am, Tuesday-Friday, and 1-3:30pm, Monday-Friday. Food pantry Monday-Friday 1pm-3pm, fresh items, Mon.-Wed.-Fri.  Agency Name: [facility] Address: [address] [place], [place] [address] Phone:  [phone] Website: www.safealamance.org Services Offered: Distribute food Tues and Sats from 9:00am-noon. Closed 1st Saturday of each month. Call for details  Agency Name: [facility] Address: [place] [address] [place] [address] Phone: [phone]  Services Offered: Delivers meals every Thursday

## 2023-04-06 ENCOUNTER — Telehealth: Payer: Self-pay

## 2023-04-06 NOTE — Transitions of Care (Post Inpatient/ED Visit) (Signed)
   04/06/2023  Name: Christina Richards MRN: [id number] DOB: 11/24/1960  Today's TOC FU Call Status: Today's TOC FU Call Status:: Successful TOC FU Call Completed TOC FU Call Complete Date: 04/06/23 Patient's Name and Date of Birth confirmed.  Transition Care Management Follow-up Telephone Call Date of Discharge: 04/05/23 Discharge Facility[facility]) Type of Discharge: Inpatient Admission Primary Inpatient Discharge Diagnosis:: Acute bronchitis with bronchospasm How have you been since you were released from the hospital?:  (inquired about how patient is feeling and she reports " I do not want to speak to anyone from [facility] health after the way I was treated and patient hung up.)  Lonia Chimera, RN, BSN, CEN Population Health- Transition of Care Team.  [facility] [phone]

## 2023-12-08 ENCOUNTER — Ambulatory Visit: Payer: MEDICAID | Admitting: Diagnostic Neuroimaging

## 2024-01-19 ENCOUNTER — Ambulatory Visit: Payer: MEDICAID | Admitting: Diagnostic Neuroimaging

## 2024-01-19 ENCOUNTER — Encounter: Payer: Self-pay | Admitting: Diagnostic Neuroimaging

## 2024-03-06 ENCOUNTER — Other Ambulatory Visit (HOSPITAL_BASED_OUTPATIENT_CLINIC_OR_DEPARTMENT_OTHER): Payer: Self-pay

## 2024-03-06 DIAGNOSIS — G4733 Obstructive sleep apnea (adult) (pediatric): Secondary | ICD-10-CM

## 2024-03-14 ENCOUNTER — Ambulatory Visit (HOSPITAL_BASED_OUTPATIENT_CLINIC_OR_DEPARTMENT_OTHER): Payer: MEDICAID | Attending: Family Medicine | Admitting: Internal Medicine

## 2024-03-14 DIAGNOSIS — G4733 Obstructive sleep apnea (adult) (pediatric): Secondary | ICD-10-CM | POA: Insufficient documentation

## 2024-03-21 ENCOUNTER — Other Ambulatory Visit: Payer: Self-pay | Admitting: Primary Care

## 2024-03-21 DIAGNOSIS — K5900 Constipation, unspecified: Secondary | ICD-10-CM

## 2024-03-26 DIAGNOSIS — G4733 Obstructive sleep apnea (adult) (pediatric): Secondary | ICD-10-CM | POA: Diagnosis not present

## 2024-03-26 NOTE — Procedures (Signed)
 Darryle Law [facility] [address] [place], [place] [address] Tel: [phone]   Fax: [phone]  Split Night Interpretation  Patient Name:  Christina Richards, Christina Richards Date:  03/14/2024 Referring Physician:  MAXIE BUCKLES ([id number]) %%startinterp%% Indications for Polysomnography The patient is a 63 year old Female who is 5' 4 and weighs 285.0 lbs.  Her BMI equals 49.3.  A diagnostic polysomnogram was performed to evaluate for -.Insomnia/ OSA  After 124.0 minutes of sleep time the patient exhibited sufficient respiratory events qualifying her for a CPAP trial which was then initiated.    Medications Taken:  NO MEDICATIONS TAKEN.   Polysomnogram Data A full night polysomnogram was performed recording the standard physiologic parameters including EEG, EOG, EMG, EKG, nasal and oral airflow.  Respiratory parameters of chest and abdominal movements are recorded with Piezo-Crystal motion transducers.  Oxygen saturation was recorded by pulse oximetry.    Sleep Architecture The total recording time of the diagnostic portion of the study was 202.0 minutes.  The total sleep time was 124.0 minutes.  During the diagnostic portion of the study, the patient spent 4.4% of total sleep time in Stage N1, 82.7% in Stage N2, 6.5% in Stages N3, and 6.5% in REM.   Sleep latency was 24.0 minutes.  REM latency was 123.0 minutes.  Sleep Efficiency was 61.4%.  Wake after Sleep Onset time was 54.0 minutes.   At 02:26:52 AM the patient was placed on PAP treatment and was titrated at pressures ranging from 8* cm/H20 with supplemental oxygen at - up to 13* cm/H20 with supplemental oxygen at -.  The total recording time of the treatment portion of the study was 183.0 minutes.  The total sleep time was 126.5 minutes.  During the treatment portion of the study, the patient spent 4.7% of total sleep time in Stage N1, 69.6% in Stage N2, 0.0% in Stages N3, and 25.7% in REM.   Sleep latency was 14.0  minutes.  REM latency was 66.0 minutes.  Sleep Efficiency was 69.1%.  Wake after Sleep Onset time was 42.5 minutes.  Respiratory Events During the diagnostic portion of the study, the polysomnogram revealed a presence of 41 obstructive, 2 central, and 1 mixed apnea resulting in an Apnea index of 21.3 events per hour.  There were 52 hypopneas (>=3% desaturation and/or arousal) resulting in an Apnea\Hypopnea Index (AHI >=3% desaturation and/or arousal) of 46.5 events per hour.  There were 33 hypopneas (>=4% desaturation) resulting in an Apnea\Hypopnea Index (AHI >=4% desaturation) of 37.3 events per hour.  There were 24 Respiratory Effort Related Arousals resulting in a RERA index of 11.6 events per hour. The Respiratory Disturbance Index is 58.1 events per hour.  The snore index was 453.9 events per hour.  Mean oxygen saturation was 95.5%.  The lowest oxygen saturation during sleep was 86.0%.  Time spent <=88% oxygen saturation was 0.4 minutes (0.2%).  During the treatment portion of the study, the polysomnogram revealed a presence of - obstructive, 9 centrals, and - mixed apneas resulting in an Apnea index of 4.3 events per hour.  There were 6 hypopneas (>=3% desaturation and/or arousal) resulting in an Apnea\Hypopnea Index (AHI >=3% desaturation and/or arousal) of 7.1 events per hour.  There were 2 hypopneas (>=4% desaturation) resulting in an Apnea\Hypopnea Index (AHI >=4% desaturation) of 5.2 events per hour.  There were 24 Respiratory Effort Related Arousals resulting in a RERA index of 11.4 events per hour. The Respiratory Disturbance Index is 18.5 events  per hour.  The snore index was 33.7 events per hour.  Mean oxygen saturation was 97.5%.  The lowest oxygen saturation during sleep was 93.0%.  Time spent <=88% oxygen saturation was - minutes (-).  Limb Activity During the diagnostic portion of the study, there were - limb movements recorded.  Of this total, - were classified as PLMs.  Of the PLMs, -  were associated with arousals.  The Limb Movement index was - per hour while the PLM index was - per hour.  During the treatment portion of the study, there were - limb movements recorded.  Of this total, - were classified as PLMs.  Of the PLMs, - were associated with arousals.  The Limb Movement index was - per hour while the PLM index was - per hour.  Cardiac Summary During the diagnostic portion of the study, the average pulse rate was 63.4 bpm.  The minimum pulse rate was 54.0 bpm while the maximum pulse rate was 91.0 bpm.  During the treatment portion of the study, the average pulse rate was 65.4 bpm.  The minimum pulse rate was 53.0 bpm while the maximum pulse rate was 85.0 bpm.   Comments : Severe obstructive sleep apnea, AHI (4%) 37.3/HR. Loud snoring with oxygen desaturation to nadir 86%, mean 95.5%.   Diagnosis: Obstructive sleep apnea  Recommendations: Suggest autopap 5-20, CPAP titration sleep study or ENT surgical evaluation. DME provider would need to help with CPAP desensitization, or CPAP desensitization can be ordered through [facility] for this patient who complains of claustrophobia with CPAP.   This study was personally reviewed and electronically signed by: NEYSA REGGY BIRCH, MD Accredited Board Certified in Sleep Medicine Date/Time: 03/26/24  12:47    %%endinterp%%  Split Night Report  Patient Name: Christina Richards, Christina Richards Study Date: 03/14/2024  Date of Birth: [birth year] Study Type: Split Night  Age: 39 year MRN #: [id number]  Sex: Female Interpreting Physician: NEYSA REGGY, [id number]  Height: 5' 4 Referring Physician: MAXIE BUCKLES [phone])  Weight: 285.0 lbs Recording Tech: Charlie George RPSGT  BMI: 49.3 Scoring Tech: Charlie George RPSGT  ESS: 6/24 Neck Size: 14.5  Mask Type PHILIPS WISP NASAL Final Pressure: 13 CM H2O  Mask Size: LARGE Supplemental O2: 0   Study Overview  DIAGNOSTIC TREATMENT  Lights Off: 11:04:41 PM Lights Off: 02:26:41 AM  Lights On:  02:26:41 AM Lights On: 05:29:38 AM  Time in Bed: 202.0 min. Time in Bed: 183.0 min.  Total Sleep Time: 124.0 min. Total Sleep Time: 126.5 min.  Sleep Efficiency: 61.4% Sleep Efficiency: 69.1%  Sleep Latency: 24.0 min. Sleep Latency: 14.0 min.  REM Latency from Sleep Onset: 123.0 min. REM Latency from Sleep Onset: 66.0 min.  Wake After Sleep Onset: 54.0 min. Wake After Sleep Onset: 42.5 min.   DIAGNOSTIC TREATMENT   Count Index  Count Index  Awakenings: 7 3.4 Awakenings: 10 4.7  Arousals: 98 47.4 Arousals: 24 11.4  AHI (>=3% Desat and/or Ar.): 96 46.5 AHI (>=3% Desat and/or Ar.): 15 7.1  AHI (>=4% Desat): 77 37.3 AHI (>=4% Desat): 11 5.2   Limb Movements: - - Limb Movements: - -  Snore: 938 453.9 Snore: 71 33.7  Desaturations: 91 44.0 Desaturations: 12 5.7  Minimum SpO2 TST: 86.0% Minimum SpO2 TST: 93.0%    Sleep Architecture   DIAGNOSTIC TREATMENT ENTIRE NIGHT  Stages Time (mins) % Sleep Time Time (mins) % Sleep Time Time (mins) % Sleep Time  Wake 78.5  56.5  135.0   Stage N1 5.5  4.4% 6.0 4.7% 11.5 4.6%  Stage N2 102.5 82.7% 88.0 69.6% 190.5 76.0%  Stage N3 8.0 6.5% 0.0 0.0% 8.0 3.2%  REM 8.0 6.5% 32.5 25.7% 40.5 16.2%   Arousal Summary   DIAGNOSTIC TREATMENT   NREM REM TST Index NREM REM TST Index  Respiratory Ar. 80 11 91 44.0 16 6 22  10.4  PLM Ar. - - - - - - - -  Isolated Limb Movement Ar. - - - - - - - -  Snore Ar. 3 2 5  2.4 2 - 2 0.9  Spontaneous Ar. 2 - 2 1.0 - - - -  Total Ar. 85 13 98 47.4 18 6 24  11.4    Respiratory Summary  DIAGNOSTIC By Sleep Stage By Body Position Total   NREM REM Supine Non-Supine   Time (min) 116.0 8.0 - 124.0 124.0         Obstructive Apnea 30 11 - 41 41  Mixed Apnea 1 - - 1 1  Central Apnea 2 - - 2 2  Total Apneas 33 11 - 44 44  Total Apnea Index 17.1 82.5 - 21.3 21.3         Hypopneas (>=3% Desat and/or Ar.) 52 - - 52 52  AHI (>=3% Desat and/or Ar.) 44.0 82.5 - 46.5 46.5         Hypopneas (>=4% Desat) 33 - - 33 33  AHI (>=4%  Desat) 34.1 82.5 - 37.3 37.3          RERAs 24 - - 24 24  RERA Index 12.4 - - 11.6 11.6         RDI 56.4 82.5 - 58.1 58.1    Respiratory Event Type Index  Central Apneas 1.0  Obstructive Apneas 19.8  Mixed Apneas 0.5  Central Hypopneas -  Obstructive Hypopneas 24.7  Central Apnea + Hypopnea (CAHI) 1.0  Obstructive Apnea + Hypopnea (OAHI) 45.5    TREATMENT By Sleep Stage By Body Position Total   NREM REM Supine Non-Supine   Time (min) 94.0 32.5 2.0 124.5 126.5         Obstructive Apnea - - - - -  Mixed Apnea - - - - -  Central Apnea 5 4 - 9 9  Total Apneas 5 4 - 9 9  Total Apnea Index 3.2 7.4 - 4.3 4.3         Hypopneas (>=3% Desat and/or Ar.) 3 3 - 6 6  AHI (>=3% Desat and/or Ar.) 5.1 12.9 - 7.2 7.1         Hypopneas (>=4% Desat) - 2 - 2 2  AHI (>=4% Desat) 3.2 11.1 - 5.3 5.2          RERAs 21 3 - 24 24  RERA Index 13.4 5.5 - 11.6 11.4         RDI 18.5 18.5 - 18.8 18.5    Respiratory Event Type Index  Central Apneas 4.3  Obstructive Apneas -  Mixed Apneas -  Central Hypopneas -  Obstructive Hypopneas 1.9  Central Apnea + Hypopnea (CAHI) 4.3  Obstructive Apnea + Hypopnea (OAHI) 2.8    Respiratory Event Durations   DIAGNOSTIC TREATMENT  Apnea NREM REM NREM REM  Average (seconds) 15.7 16.4 13.5 11.4  Maximum (seconds) 26.8 24.8 18.0 13.1  Hypopnea      Average (seconds) 22.1 - 36.1 21.9  Maximum (seconds) 28.6 - 60.7 26.6    Limb Movement Summary   DIAGNOSTIC TREATMENT   Count Index Count Index  Isolated Limb Movements - - - -  Periodic Limb Movements (PLMs) - - - -  Total Limb Movements - - - -    Oxygen Saturation Summary   DIAGNOSTIC TREATMENT   Wake NREM REM TST Wake NREM REM TST  Average SpO2 95.9% 95.2% 96.9% 95.4% 97.7% 97.4% 97.7% 97.5%  Minimum SpO2 91.0% 86.0% 89.0% 86.0%  95.0% 93.0% 96.0% 93.0%   Maximum SpO2 100.0% 99.0% 99.0% 99.0%  99.0% 100.0% 99.0% 100.0%    DIAGNOSTIC Oxygen Saturation Distribution  Range (%) Time in range  (min) Time in range (%)   90.0 - 100.0 198.2 98.0%  80.0 - 90.0 1.8 0.9%  70.0 - 80.0 - -  60.0 - 70.0 - -  50.0 - 60.0 - -  0.0 - 50.0 - -  Time Spent <=88% SpO2  Range (%) Time in range (min) Time in range (%)  0.0 - 88.0 0.4 0.2%      Count Index  Desaturations: 91 44.0   TREATMENT Oxygen Saturation Distribution  Range (%) Time in range (min) Time in range (%)   90.0 - 100.0 179.5 98.8%  80.0 - 90.0 - -  70.0 - 80.0 - -  60.0 - 70.0 - -  50.0 - 60.0 - -  0.0 - 50.0 - -  Time Spent <=88% SpO2  Range (%) Time in range (min) Time in range (%)  0.0 - 88.0 - -      Count Index  Desaturations: 12 5.7    Cardiac Summary   DIAGNOSTIC TREATMENT   Wake NREM REM Total Wake NREM REM Total  Average Pulse Rate (BPM) 65.5 62.3 59.4 63.4 67.6 63.6 66.8 65.4  Minimum Pulse Rate (BPM) 57.0 54.0 54.0 54.0 58.0 54.0 53.0 53.0  Maximum Pulse Rate (BPM) 88.0 91.0 72.0 91.0 85.0 77.0 76.0 85.0   Pulse Rate Distribution   DIAGNOSTIC  Range (bpm) Time in range (min) Time in range (%)  0.0 - 40.0 - -  40.0 - 60.0 36.2 17.9%  60.0 - 80.0 163.5 80.9%  80.0 - 100.0 0.3 0.1%  100.0 - 120.0 - -  120.0 - 140.0 - -  140.0 - 200.0 - -   TREATMENT  Range (bpm) Time in range (min) Time in range (%)  0.0 - 40.0 - -  40.0 - 60.0 64.4 35.2%  60.0 - 80.0 115.1 63.0%  80.0 - 100.0 0.9 0.5%  100.0 - 120.0 - -  120.0 - 140.0 - -  140.0 - 200.0 - -    Titration Summary  PAP Device PAP Level O2 Level Time (min) Wake (min) NREM (min) REM (min) Supine TST (min) Sleep Eff% OA# CA# MA# Hyp# (>=3%) AHI (>=3%) Hyp# (>=4%) AHI (>=%4) RERA RDI SpO2 <=88% (min) Min SpO2 Mean SpO2 Ar. Index  - Off - 202.5 78.5 116.0 8.0  0.0 61.2% 41 2 1 52 46.5 33  37.3 24  58.1  0.4 86.0 95.4 47.4  CPAP 8 - 23.5 14.0 9.5 0.0  40.4% - - - - - -  - 6  37.9  0.0 96.0 97.0 37.9  CPAP 9 - 32.0 13.0 19.0 0.0  59.4% - - - 1 3.2 -  - 3  12.6  0.0 93.0 96.7 6.3  CPAP 10 - 31.5 7.5 17.0 7.0  76.2% - - - - - -  - 6   15.0  0.0 94.0 97.2 7.5  CPAP 11 - 29.0 0.5 7.5 21.0  98.3% - 7 - 4 23.2 1  16.8 2  27.4  0.0 94.0 97.8 14.7  CPAP 12 - 30.5 4.5 21.5 4.5  85.2% - 2 - 1 6.9 1  6.9 4  16.2  0.0 94.0 97.7 6.9  CPAP 13 - 36.5 17.0 19.5 0.0  2.0 53.4% - - - - - -  - 3  9.2  0.0 97.0 98.0 9.2    Hypnograms                           Technologist Comments            The patient arrived for a split night study. The patient was placed in room 2. The procedure was explained, and the patient had no questions. The patient has a history of CPAP use, but stated that she does not like to wear the masks due to claustrophobia.           Prior to the start of the study, the patient was fitted with a large Philips Wisp nasal mask and trialed CPAP at a pressure of 5 cm, 6 cm, and 8 cm H2O. The patient appeared visible uncomfortable with the mask, but stated that she will try to tolerate it. The patient met the lab's split criteria shortly after the cut off time, but was placed on CPAP anyways. CPAP was started at a pressure of 8 cm H2O and was increased to a pressure of 13 cm H2O. The patient tolerated the pressures well while asleep.           The patient slept in the left position. The patient stated that she could not sleep supine. Oral venting was observed. No PLMs, bruxism, seizure, or spike wave activity was observed. Snoring was moderate and well controlled on PAP therapy. No ECG events were observed. All stages of sleep were recorded. Supine REM on an ideal pressure was not achieved. The patient had no bathroom breaks.                       Reggy Salt Diplomate, Biomedical Engineer of Sleep Medicine  ELECTRONICALLY SIGNED ON:  03/26/2024, 12:31 PM [facility] PH: [phone]   FX: [phone] ACCREDITED BY THE AMERICAN ACADEMY OF SLEEP MEDICINE

## 2024-03-26 NOTE — Procedures (Signed)
 Indications for Polysomnography The patient is a 63 year old Female who is 5' 4 and weighs 285.0 lbs.  Her BMI equals 49.3.  A diagnostic polysomnogram was performed to evaluate for -.  After 124.0 minutes of sleep time the patient exhibited sufficient respiratory events qualifying  her for a CPAP trial which was then initiated.  Medications Taken:NO MEDICATIONS TAKEN. Polysomnogram Data A full night polysomnogram was performed recording the standard physiologic parameters including EEG, EOG, EMG, EKG, nasal and oral airflow.  Respiratory parameters of chest and abdominal movements are recorded with Piezo-Crystal motion transducers.   Oxygen saturation was recorded by pulse oximetry.  Sleep Architecture The total recording time of the diagnostic portion of the study was 202.0 minutes.  The total sleep time was 124.0 minutes.  During the diagnostic portion of the study, the patient spent 4.4% of total sleep time in Stage N1, 82.7% in Stage N2, 6.5% in  Stages N3, and 6.5% in REM.   Sleep latency was 24.0 minutes.  REM latency was 123.0 minutes.  Sleep Efficiency was 61.4%.  Wake after Sleep Onset time was 54.0 minutes.  At 02:26:52 AM the patient was placed on PAP treatment and was titrated at pressures ranging from 8* cm/H20 with supplemental oxygen at - up to 13* cm/H20 with supplemental oxygen at -.  The total recording time of the treatment portion of the study was  183.0 minutes.  The total sleep time was 126.5 minutes.  During the treatment portion of the study, the patient spent 4.7% of total sleep time in Stage N1, 69.6% in Stage N2, 0.0% in Stages N3, and 25.7% in REM.   Sleep latency was 14.0 minutes.  REM  latency was 66.0 minutes.  Sleep Efficiency was 69.1%.  Wake after Sleep Onset time was 42.5 minutes.  Respiratory Events During the diagnostic portion of the study, the polysomnogram revealed a presence of 41 obstructive, 2 central, and 1 mixed apnea resulting in an Apnea index of  21.3 events per hour.  There were 52 hypopneas (GreaterEqual to3% desaturation and/or  arousal) resulting in an Apnea\Hypopnea Index (AHI GreaterEqual to3% desaturation and/or arousal) of 46.5 events per hour.  There were 33 hypopneas (GreaterEqual to4% desaturation) resulting in an Apnea\Hypopnea Index (AHI GreaterEqual to4% desaturation)  of 37.3 events per hour.  There were 24 Respiratory Effort Related Arousals resulting in a RERA index of 11.6 events per hour. The Respiratory Disturbance Index is 58.1 events per hour.  The snore index was 453.9 events per hour.  Mean oxygen saturation  was 95.5%.  The lowest oxygen saturation during sleep was 86.0%.  Time spent LessEqual to88% oxygen saturation was  minutes ().  During the treatment portion of the study, the polysomnogram revealed a presence of - obstructive, 9 centrals, and - mixed apneas resulting in an Apnea index of 4.3 events per hour.  There were 6 hypopneas (GreaterEqual to3% desaturation and/or arousal)  resulting in an Apnea\Hypopnea Index (AHI GreaterEqual to3% desaturation and/or arousal) of 7.1 events per hour.  There were 2 hypopneas (GreaterEqual to4% desaturation) resulting in an Apnea\Hypopnea Index (AHI GreaterEqual to4% desaturation) of 5.2  events per hour.  There were 24 Respiratory Effort Related Arousals resulting in a RERA index of 11.4 events per hour. The Respiratory Disturbance Index is 18.5 events per hour.  The snore index was 33.7 events per hour.  Mean oxygen saturation was  97.5%.  The lowest oxygen saturation during sleep was 93.0%.  Time spent LessEqual to88% oxygen saturation was  minutes ().  Limb Activity During the diagnostic portion of the study, there were - limb movements recorded.  Of this total, - were classified as PLMs.  Of the PLMs, - were associated with arousals.  The Limb Movement index was - per hour while the PLM index was - per hour.  During the treatment portion of the study, there were - limb  movements recorded.  Of this total, - were classified as PLMs.  Of the PLMs, - were associated with arousals.  The Limb Movement index was - per hour while the PLM index was - per hour.  Cardiac Summary During the diagnostic portion of the study, the average pulse rate was 63.4 bpm.  The minimum pulse rate was 54.0 bpm while the maximum pulse rate was 91.0 bpm.  During the treatment portion of the study, the average pulse rate was 65.4 bpm.  The minimum pulse rate was 53.0 bpm while the maximum pulse rate was 85.0 bpm.  Comments :  Diagnosis:  Recommendations:   This study was personally reviewed and electronically signed by: NEYSA REGGY BIRCH, MD Accredited Board Certified in Sleep Medicine Date/Time:

## 2024-04-05 ENCOUNTER — Other Ambulatory Visit: Payer: Self-pay | Admitting: Family Medicine

## 2024-04-05 DIAGNOSIS — Z1231 Encounter for screening mammogram for malignant neoplasm of breast: Secondary | ICD-10-CM

## 2024-05-01 ENCOUNTER — Ambulatory Visit
Admission: RE | Admit: 2024-05-01 | Discharge: 2024-05-01 | Disposition: A | Discharge status: Home / Self Care | Payer: MEDICAID | Attending: Primary Care | Admitting: Primary Care

## 2024-05-01 ENCOUNTER — Ambulatory Visit
Admission: RE | Admit: 2024-05-01 | Discharge: 2024-05-01 | Disposition: A | Payer: MEDICAID | Source: Ambulatory Visit | Attending: Primary Care

## 2024-05-01 DIAGNOSIS — K5900 Constipation, unspecified: Secondary | ICD-10-CM | POA: Diagnosis present

## 2024-06-20 ENCOUNTER — Ambulatory Visit: Payer: MEDICAID | Admitting: Diagnostic Neuroimaging

## 2024-07-17 ENCOUNTER — Ambulatory Visit: Payer: MEDICAID | Admitting: Diagnostic Neuroimaging
